# Patient Record
Sex: Female | Born: 1975 | Race: White | Hispanic: No | Marital: Married | Smoking: Never smoker
Health system: Southern US, Community
[De-identification: ages and names within clinical notes are randomized; demographics above are authoritative.]

## PROBLEM LIST (undated history)

## (undated) DIAGNOSIS — G40909 Epilepsy, unspecified, not intractable, without status epilepticus: Secondary | ICD-10-CM

## (undated) DIAGNOSIS — Z973 Presence of spectacles and contact lenses: Secondary | ICD-10-CM

## (undated) DIAGNOSIS — R569 Unspecified convulsions: Secondary | ICD-10-CM

## (undated) DIAGNOSIS — N6029 Fibroadenosis of unspecified breast: Secondary | ICD-10-CM

## (undated) HISTORY — DX: Unspecified convulsions: R56.9

## (undated) HISTORY — PX: BREAST BIOPSY: SHX20

## (undated) HISTORY — DX: Fibroadenosis of unspecified breast: N60.29

## (undated) HISTORY — PX: OTHER SURGICAL HISTORY: SHX169

## (undated) HISTORY — DX: Epilepsy, unspecified, not intractable, without status epilepticus: G40.909

---

## 1998-03-27 ENCOUNTER — Other Ambulatory Visit: Admission: RE | Admit: 1998-03-27 | Discharge: 1998-03-27 | Payer: Self-pay | Admitting: Gynecology

## 1999-07-15 ENCOUNTER — Other Ambulatory Visit: Admission: RE | Admit: 1999-07-15 | Discharge: 1999-07-15 | Payer: Self-pay | Admitting: Gynecology

## 2000-03-02 ENCOUNTER — Other Ambulatory Visit: Admission: RE | Admit: 2000-03-02 | Discharge: 2000-03-02 | Payer: Self-pay | Admitting: Gynecology

## 2000-09-14 ENCOUNTER — Other Ambulatory Visit: Admission: RE | Admit: 2000-09-14 | Discharge: 2000-09-14 | Payer: Self-pay | Admitting: Gynecology

## 2005-06-23 ENCOUNTER — Inpatient Hospital Stay: Payer: Self-pay | Admitting: Unknown Physician Specialty

## 2010-06-12 HISTORY — PX: BREAST SURGERY: SHX581

## 2010-06-24 ENCOUNTER — Ambulatory Visit: Payer: Self-pay | Admitting: Family Medicine

## 2011-07-27 ENCOUNTER — Ambulatory Visit: Payer: Self-pay | Admitting: General Surgery

## 2012-09-02 ENCOUNTER — Other Ambulatory Visit: Payer: Self-pay

## 2012-09-02 MED ORDER — CARBAMAZEPINE ER 400 MG PO TB12: 400.0000 mg | ORAL_TABLET | Freq: Two times a day (BID) | ORAL | Status: DC

## 2012-10-19 HISTORY — PX: INTRAUTERINE DEVICE (IUD) INSERTION: SHX5877

## 2012-11-12 ENCOUNTER — Other Ambulatory Visit: Payer: Self-pay | Admitting: Neurology

## 2013-07-04 ENCOUNTER — Telehealth: Payer: Self-pay | Admitting: *Deleted

## 2013-07-04 NOTE — Telephone Encounter (Signed)
Prior Dr. Sandria ManlyLove patient.  Needs new physician.  States that she has seen Darrol Angelarolyn Martin before. Needs an appointment in order to get her medication refilled.  Please call as necessary.  Thank you

## 2013-07-05 NOTE — Telephone Encounter (Signed)
Dr. Terrace ArabiaYan, has cosigned Darrol Angelarolyn Martin, NP notes in [facility] (10-01-2011).

## 2013-07-06 ENCOUNTER — Telehealth: Payer: Self-pay | Admitting: Nurse Practitioner

## 2013-07-10 ENCOUNTER — Telehealth: Payer: Self-pay | Admitting: Nurse Practitioner

## 2013-07-10 MED ORDER — CARBAMAZEPINE ER 400 MG PO TB12: 400.0000 mg | ORAL_TABLET | Freq: Two times a day (BID) | ORAL | Status: DC

## 2013-07-10 NOTE — Telephone Encounter (Signed)
Rx has been resent 

## 2013-07-10 NOTE — Telephone Encounter (Signed)
Patient has appt scheduled.  Rx has been sent

## 2013-07-10 NOTE — Telephone Encounter (Signed)
Pt called requesting her carbamazepine (TEGRETOL XR) 400 MG 12 hr tablet [facility] [place]. I updated her information in the system she is no longer using [facility]. Thanks

## 2013-09-15 ENCOUNTER — Other Ambulatory Visit: Payer: Self-pay | Admitting: Neurology

## 2013-10-30 ENCOUNTER — Encounter: Payer: Self-pay | Admitting: *Deleted

## 2013-10-31 ENCOUNTER — Ambulatory Visit (INDEPENDENT_AMBULATORY_CARE_PROVIDER_SITE_OTHER): Payer: BC Managed Care – PPO | Admitting: Nurse Practitioner

## 2013-10-31 ENCOUNTER — Encounter: Payer: Self-pay | Admitting: Nurse Practitioner

## 2013-10-31 VITALS — BP 112/77 | HR 77 | Ht 71.0 in | Wt 195.0 lb

## 2013-10-31 DIAGNOSIS — G40209 Localization-related (focal) (partial) symptomatic epilepsy and epileptic syndromes with complex partial seizures, not intractable, without status epilepticus: Secondary | ICD-10-CM

## 2013-10-31 DIAGNOSIS — Z5181 Encounter for therapeutic drug level monitoring: Secondary | ICD-10-CM

## 2013-10-31 MED ORDER — CARBAMAZEPINE ER 400 MG PO TB12: 400.0000 mg | ORAL_TABLET | Freq: Two times a day (BID) | ORAL | Status: DC

## 2013-10-31 NOTE — Progress Notes (Signed)
I have read the note, and I agree with the clinical assessment and plan.  Reshunda Strider KEITH   

## 2013-10-31 NOTE — Progress Notes (Signed)
[facility]  PATIENT: Connie Fuentes DOB: Feb 24, 1976   REASON FOR VISIT: follow up for seizure disorder   HISTORY OF PRESENT ILLNESS:Connie Fuentes, 38 year old white female returns for followup. She was last seen in our office 10/01/11. She has a history of seizure disorder complex partial. Her last seizure activity was last year when she claims she was under a lot of stress at work. She is no longer at that job.  She is currently taking Tegretol-XR twice a day, denies missing any doses of the medication. She denies any falls, any periods of daytime drowsiness, any balance issues.  She returns for reevaluation  REVIEW OF SYSTEMS: Full 14 system review of systems performed and notable only for those listed, all others are neg:  Constitutional: N/A  Cardiovascular: N/A  Ear/Nose/Throat: N/A  Skin: N/A  Eyes: N/A  Respiratory: N/A  Gastroitestinal: N/A  Hematology/Lymphatic: N/A  Endocrine: N/A Musculoskeletal:N/A  Allergy/Immunology: N/A  Neurological: N/A Psychiatric: N/A Sleep : NA   ALLERGIES: Not on File  HOME MEDICATIONS: Outpatient Prescriptions Prior to Visit  Medication Sig Dispense Refill  . carbamazepine (TEGRETOL XR) 400 MG 12 hr tablet TAKE 1 TABLET TWICE A DAY  180 tablet  0   No facility-administered medications prior to visit.    PAST MEDICAL HISTORY: Past Medical History  Diagnosis Date  . Seizure disorder     PAST SURGICAL HISTORY: Past Surgical History  Procedure Laterality Date  . None      FAMILY HISTORY: History reviewed. No pertinent family history.  SOCIAL HISTORY: History   Social History  . Marital Status: Married    Spouse Name: [name]    Number of Children: 1  . Years of Education: 16   Occupational History  . Not on file.   Social History Main Topics  . Smoking status: Never Smoker   . Smokeless tobacco: Never Used  . Alcohol Use: Yes     Comment: 2 per week  . Drug Use: No  . Sexual Activity: Not on  file   Other Topics Concern  . 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   Patient is working full-time.   Patient has a Chief Operating Officer' degree   Patient is right-handed.   Patient drinks two cups of coffee daily.     PHYSICAL EXAM  Filed Vitals:   10/31/13 0831  BP: 112/77  Pulse: 77  Height: 5\' 11"  (1.803 m)  Weight: 195 lb (88.451 kg)   Body mass index is 27.21 kg/(m^2). General: well developed, well  nourished, seated, in no evident distress Neurologic Exam  Mental Status: Awake and fully alert.  Oriented to place and time.  Recent and remote memory intact.  Attention span, concentration, and fund of knowledge appropriate.  Mood and affect appropriate. Cranial Nerves:  Pupils equal, briskly reactive to light.  Extraocular movements full without nystagmus.  Visual fields full to confrontation.  Hearing intact and symmetric to finger snap.  Facial sensation intact.  Face, tongue, palate move normally and symmetrically.  Neck flexion and extension normal. Motor: Normal bulk and tone.  Normal strength in all tested extremity muscles. Coordination: Rapid alternating movements normal in all extremities.  Finger-to-nose and heel-to-shin performed accurately bilaterally. Gait and Station: Arises from chair without difficulty.  Stance is normal.  Gait demonstrates normal stride length and balance.  Able to heel, toe, and tandem walk without difficulty. Reflexes:  2+ and symmetric.  Toes downgoing.  DIAGNOSTIC DATA (LABS, IMAGING, TESTING) - ASSESSMENT AND PLAN  38 y.o. year old female  has a past medical history of Seizure disorder. here  to followup. She is currently on Tegretol without side effects. She is a patient of Dr. Terrace ArabiaYan who is out of the office   Continue Tegretol at current dose Will refill for one year Check labs today Followup yearly and when necessary Nilda RiggsNancy Carolyn Latrell Reitan, [facility]GNP, [facility],  APRN  [facility] [address], [address] [place], [place] [address] [phone]

## 2013-10-31 NOTE — Patient Instructions (Signed)
Continue Tegretol at current dose Will refill for one year Check labs today Followup yearly and when necessary

## 2013-11-01 LAB — CBC WITH DIFFERENTIAL/PLATELET
Basophils Absolute: 0 10*3/uL (ref 0.0–0.2)
Basos: 1 %
Eos: 3 %
Eosinophils Absolute: 0.2 10*3/uL (ref 0.0–0.4)
HCT: 40.2 % (ref 34.0–46.6)
Hemoglobin: 13.9 g/dL (ref 11.1–15.9)
Lymphocytes Absolute: 1.9 10*3/uL (ref 0.7–3.1)
Lymphs: 33 %
MCH: 30.9 pg (ref 26.6–33.0)
MCHC: 34.6 g/dL (ref 31.5–35.7)
MCV: 89 fL (ref 79–97)
Monocytes Absolute: 0.6 10*3/uL (ref 0.1–0.9)
Monocytes: 11 %
Neutrophils Absolute: 3.1 10*3/uL (ref 1.4–7.0)
Neutrophils Relative %: 52 %
RBC: 4.5 x10E6/uL (ref 3.77–5.28)
RDW: 13 % (ref 12.3–15.4)
WBC: 5.8 10*3/uL (ref 3.4–10.8)

## 2013-11-01 LAB — COMPREHENSIVE METABOLIC PANEL
ALT: 21 IU/L (ref 0–32)
AST: 19 IU/L (ref 0–40)
Albumin/Globulin Ratio: 1.7 (ref 1.1–2.5)
Albumin: 4.3 g/dL (ref 3.5–5.5)
Alkaline Phosphatase: 80 IU/L (ref 39–117)
BUN/Creatinine Ratio: 22 — ABNORMAL HIGH (ref 8–20)
BUN: 13 mg/dL (ref 6–20)
CO2: 24 mmol/L (ref 18–29)
Calcium: 9.2 mg/dL (ref 8.7–10.2)
Chloride: 102 mmol/L (ref 96–108)
Creatinine, Ser: 0.6 mg/dL (ref 0.57–1.00)
GFR calc Af Amer: 135 mL/min/{1.73_m2} (ref >59)
GFR calc non Af Amer: 117 mL/min/{1.73_m2} (ref >59)
Globulin, Total: 2.6 g/dL (ref 1.5–4.5)
Glucose: 57 mg/dL — ABNORMAL LOW (ref 65–99)
Potassium: 4.1 mmol/L (ref 3.5–5.2)
Sodium: 137 mmol/L (ref 134–144)
Total Bilirubin: 0.4 mg/dL (ref 0.0–1.2)
Total Protein: 6.9 g/dL (ref 6.0–8.5)

## 2013-11-01 LAB — CARBAMAZEPINE LEVEL, TOTAL: Carbamazepine Lvl: 8.7 ug/mL (ref 4.0–12.0)

## 2013-11-01 NOTE — Progress Notes (Signed)
Quick Note:  I called and LMVM Cell # results of her labs, that they looked good. She is to call back if needed. ______

## 2014-11-02 ENCOUNTER — Ambulatory Visit: Payer: BC Managed Care – PPO | Admitting: Nurse Practitioner

## 2014-11-07 ENCOUNTER — Ambulatory Visit (INDEPENDENT_AMBULATORY_CARE_PROVIDER_SITE_OTHER): Payer: BC Managed Care – PPO | Admitting: Nurse Practitioner

## 2014-11-07 ENCOUNTER — Encounter: Payer: Self-pay | Admitting: Nurse Practitioner

## 2014-11-07 VITALS — BP 125/80 | HR 73 | Ht 71.0 in | Wt 192.8 lb

## 2014-11-07 DIAGNOSIS — G40209 Localization-related (focal) (partial) symptomatic epilepsy and epileptic syndromes with complex partial seizures, not intractable, without status epilepticus: Secondary | ICD-10-CM

## 2014-11-07 DIAGNOSIS — Z5181 Encounter for therapeutic drug level monitoring: Secondary | ICD-10-CM

## 2014-11-07 MED ORDER — CARBAMAZEPINE ER 400 MG PO TB12: 400.0000 mg | ORAL_TABLET | Freq: Two times a day (BID) | ORAL | Status: DC

## 2014-11-07 NOTE — Patient Instructions (Signed)
Will renew Tegretol Obtain labs today Call for seizure activity F/U yearly

## 2014-11-07 NOTE — Progress Notes (Signed)
I have reviewed and agreed above plan. 

## 2014-11-07 NOTE — Progress Notes (Signed)
[facility]  PATIENT: Connie Fuentes DOB: 03/26/1976   REASON FOR VISIT: Follow-up for history of seizure disorder HISTORY FROM: Patient    HISTORY OF PRESENT ILLNESS:Connie Fuentes, 39 year old white female returns for followup. She was last seen in our office 10/31/2013 . She has a history of seizure disorder complex partial. Her last seizure activity was 2014.She claims she was under a lot of stress at work.  She is currently taking Tegretol-XR twice a day, denies missing any doses of the medication. She denies any falls, any periods of daytime drowsiness, any balance issues. She returns for reevaluation   REVIEW OF SYSTEMS: Full 14 system review of systems performed and notable only for those listed, all others are neg:  Constitutional: neg  Cardiovascular: neg Ear/Nose/Throat: neg  Skin: neg Eyes: neg Respiratory: neg Gastroitestinal: neg  Hematology/Lymphatic: neg  Endocrine: neg Musculoskeletal:neg Allergy/Immunology: neg Neurological: neg Psychiatric: neg Sleep : neg   ALLERGIES: No Known Allergies  HOME MEDICATIONS: Outpatient Prescriptions Prior to Visit  Medication Sig Dispense Refill  . carbamazepine (TEGRETOL XR) 400 MG 12 hr tablet Take 1 tablet (400 mg total) by mouth 2 (two) times daily. 180 tablet 3   No facility-administered medications prior to visit.    PAST MEDICAL HISTORY: Past Medical History  Diagnosis Date  . Seizure disorder     PAST SURGICAL HISTORY: Past Surgical History  Procedure Laterality Date  . None      FAMILY HISTORY: History reviewed. No pertinent family history.  SOCIAL HISTORY: History   Social History  . Marital Status: Married    Spouse Name: Connie Fuentes  . Number of Children: 1  . Years of Education: 16   Occupational History  . Not on file.   Social History Main Topics  . Smoking status: Never Smoker   . Smokeless tobacco: Never Used  . Alcohol Use: Yes     Comment: 2 per week  . Drug  Use: No  . Sexual Activity: Not on file   Other Topics Concern  . Not on file   Social History Narrative   Patient is married Connie Fuentes) and lives at home with her husband and child.   Patient is working full-time.   Patient has a Chief Operating Officer' degree   Patient is right-handed.   Patient drinks two cups of coffee daily.     PHYSICAL EXAM  Filed Vitals:   11/07/14 0754  BP: 125/80  Pulse: 73  Height: 5\' 11"  (1.803 m)  Weight: 192 lb 12.8 oz (87.454 kg)   Body mass index is 26.9 kg/(m^2). General: well developed, well nourished, seated, in no evident distress Neurologic Exam  Mental Status: Awake and fully alert. Oriented to place and time. Recent and remote memory intact. Attention span, concentration, and fund of knowledge appropriate. Mood and affect appropriate. Cranial Nerves: Pupils equal, briskly reactive to light. Extraocular movements full without nystagmus. Visual fields full to confrontation. Hearing intact and symmetric to finger snap. Facial sensation intact. Face, tongue, palate move normally and symmetrically. Neck flexion and extension normal. Motor: Normal bulk and tone. Normal strength in all tested extremity muscles. Coordination: Rapid alternating movements normal in all extremities. Finger-to-nose and heel-to-shin performed accurately bilaterally. Gait and Station: Arises from chair without difficulty. Stance is normal. Gait demonstrates normal stride length and balance. Able to heel, toe, and tandem walk without difficulty. Reflexes: 2+ and symmetric. Toes downgoing.    DIAGNOSTIC DATA (LABS, IMAGING, TESTING)   ASSESSMENT AND PLAN  39 y.o. year old female  has a past medical history of Seizure disorder. here to follow-up. Last seizure activity was 2014.  Will renew Tegretol by mail order Obtain labs today, CBC, CMP and CBZ level Call for seizure activity F/U yearly Connie Fuentes, [facility], [facility], APRN  [facility] [address], [address] [place], [place] [address] [phone]

## 2014-11-08 LAB — COMPREHENSIVE METABOLIC PANEL
ALT: 18 IU/L (ref 0–32)
AST: 18 IU/L (ref 0–40)
Albumin/Globulin Ratio: 1.8 (ref 1.1–2.5)
Albumin: 4.1 g/dL (ref 3.5–5.5)
Alkaline Phosphatase: 70 IU/L (ref 39–117)
BUN/Creatinine Ratio: 23 — ABNORMAL HIGH (ref 8–20)
BUN: 14 mg/dL (ref 6–20)
Bilirubin Total: 0.3 mg/dL (ref 0.0–1.2)
CO2: 24 mmol/L (ref 18–29)
Calcium: 9.5 mg/dL (ref 8.7–10.2)
Chloride: 102 mmol/L (ref 97–108)
Creatinine, Ser: 0.62 mg/dL (ref 0.57–1.00)
GFR calc Af Amer: 132 mL/min/{1.73_m2} (ref >59)
GFR calc non Af Amer: 115 mL/min/{1.73_m2} (ref >59)
Globulin, Total: 2.3 g/dL (ref 1.5–4.5)
Glucose: 70 mg/dL (ref 65–99)
Potassium: 4.3 mmol/L (ref 3.5–5.2)
Sodium: 142 mmol/L (ref 134–144)
Total Protein: 6.4 g/dL (ref 6.0–8.5)

## 2014-11-08 LAB — CBC WITH DIFFERENTIAL/PLATELET
Basophils Absolute: 0 10*3/uL (ref 0.0–0.2)
Basos: 0 %
EOS (ABSOLUTE): 0.1 10*3/uL (ref 0.0–0.4)
Eos: 2 %
Hematocrit: 38.9 % (ref 34.0–46.6)
Hemoglobin: 13.1 g/dL (ref 11.1–15.9)
Immature Grans (Abs): 0 10*3/uL (ref 0.0–0.1)
Immature Granulocytes: 0 %
Lymphocytes Absolute: 1.9 10*3/uL (ref 0.7–3.1)
Lymphs: 35 %
MCH: 31 pg (ref 26.6–33.0)
MCHC: 33.7 g/dL (ref 31.5–35.7)
MCV: 92 fL (ref 79–97)
Monocytes Absolute: 0.6 10*3/uL (ref 0.1–0.9)
Monocytes: 10 %
Neutrophils Absolute: 2.9 10*3/uL (ref 1.4–7.0)
Neutrophils: 53 %
Platelets: 268 10*3/uL (ref 150–379)
RBC: 4.23 x10E6/uL (ref 3.77–5.28)
RDW: 13.1 % (ref 12.3–15.4)
WBC: 5.6 10*3/uL (ref 3.4–10.8)

## 2014-11-08 LAB — CARBAMAZEPINE LEVEL, TOTAL: Carbamazepine Lvl: 8.4 ug/mL (ref 4.0–12.0)

## 2014-11-08 NOTE — Progress Notes (Signed)
Quick Note:  I called and LMVM for pt that labs looked good. She is to call back if questions. ______

## 2014-11-09 NOTE — Progress Notes (Signed)
I have reviewed and agreed above plan. 

## 2015-11-07 ENCOUNTER — Encounter: Payer: Self-pay | Admitting: Nurse Practitioner

## 2015-11-07 ENCOUNTER — Ambulatory Visit (INDEPENDENT_AMBULATORY_CARE_PROVIDER_SITE_OTHER): Payer: BC Managed Care – PPO | Admitting: Nurse Practitioner

## 2015-11-07 VITALS — BP 115/67 | HR 68 | Ht 71.0 in | Wt 165.6 lb

## 2015-11-07 DIAGNOSIS — Z5181 Encounter for therapeutic drug level monitoring: Secondary | ICD-10-CM

## 2015-11-07 DIAGNOSIS — G40209 Localization-related (focal) (partial) symptomatic epilepsy and epileptic syndromes with complex partial seizures, not intractable, without status epilepticus: Secondary | ICD-10-CM

## 2015-11-07 MED ORDER — CARBAMAZEPINE ER 400 MG PO TB12: 400.0000 mg | ORAL_TABLET | Freq: Two times a day (BID) | ORAL | 3 refills | Status: DC

## 2015-11-07 NOTE — Patient Instructions (Signed)
Will renew Tegretol by mail order Obtain labs today, CBC, CMP and CBZ level Call for seizure activity F/U yearly  

## 2015-11-07 NOTE — Progress Notes (Signed)
[facility]  PATIENT: Connie Fuentes DOB: 1975/12/28   REASON FOR VISIT: Follow-up for history of seizure disorder HISTORY FROM: Patient    HISTORY OF PRESENT ILLNESS:Connie Fuentes, 40 year old white female returns for followup. She was last seen in our office 11/07/14.  She has a history of seizure disorder complex partial. Her last seizure activity was 2014.She claims she was under a lot of stress at work at that time.  She is currently taking Tegretol-XR twice a day, denies missing any doses of the medication. She denies any falls, any periods of daytime drowsiness, any balance issues. She returns for reevaluation   REVIEW OF SYSTEMS: Full 14 system review of systems performed and notable only for those listed, all others are neg:  Constitutional: neg  Cardiovascular: neg Ear/Nose/Throat: neg  Skin: neg Eyes: neg Respiratory: neg Gastroitestinal: neg  Hematology/Lymphatic: neg  Endocrine: neg Musculoskeletal:neg Allergy/Immunology: neg Neurological: neg Psychiatric: neg Sleep : neg   ALLERGIES: No Known Allergies  HOME MEDICATIONS: Outpatient Medications Prior to Visit  Medication Sig Dispense Refill  . carbamazepine (TEGRETOL XR) 400 MG 12 hr tablet Take 1 tablet (400 mg total) by mouth 2 (two) times daily. 180 tablet 3   No facility-administered medications prior to visit.     PAST MEDICAL HISTORY: Past Medical History:  Diagnosis Date  . Seizure disorder (HCC)     PAST SURGICAL HISTORY: Past Surgical History:  Procedure Laterality Date  . none      FAMILY HISTORY: History reviewed. No pertinent family history.  SOCIAL HISTORY: Social History   Social History  . Marital status: Married    Spouse name: [name]  . Number of children: 1  . Years of education: 75   Occupational History  . Not on file.   Social History Main Topics  . Smoking status: Never Smoker  . Smokeless tobacco: Never Used  . Alcohol use Yes     Comment:  2 per week  . Drug use: No  . Sexual activity: Not on file   Other Topics Concern  . Not on file   Social History Narrative   Patient is married [name]) and lives at home with her husband and child.   Patient is working full-time.   Patient has a Chief Operating Officer' degree   Patient is right-handed.   Patient drinks two cups of coffee daily.     PHYSICAL EXAM  Vitals:   11/07/15 0758  BP: 115/67  Pulse: 68  Weight: 165 lb 9.6 oz (75.1 kg)  Height:  (1.803 m)   Body mass index is 23.1 kg/m. General: well developed, well nourished, seated, in no evident distress Neurologic Exam  Mental Status: Awake and fully alert. Oriented to place and time. Recent and remote memory intact. Attention span, concentration, and fund of knowledge appropriate. Mood and affect appropriate. Cranial Nerves: Pupils equal, briskly reactive to light. Extraocular movements full without nystagmus. Visual fields full to confrontation. Hearing intact and symmetric to finger snap. Facial sensation intact. Face, tongue, palate move normally and symmetrically. Neck flexion and extension normal. Motor: Normal bulk and tone. Normal strength in all tested extremity muscles. Coordination: Rapid alternating movements normal in all extremities. Finger-to-nose and heel-to-shin performed accurately bilaterally. Gait and Station: Arises from chair without difficulty. Stance is normal. Gait demonstrates normal stride length and balance. Able to heel, toe, and tandem walk without difficulty. Reflexes: 2+ and symmetric. Toes downgoing.    DIAGNOSTIC DATA (LABS, IMAGING, TESTING)   ASSESSMENT AND PLAN  40  y.o. year old female  has a past medical history of Seizure disorder (HCC). here to follow-up. Last seizure activity was 2014.The patient is a current patient of Dr. Terrace Arabia who is out of the office today . This note is sent to the work in doctor.     Will renew Tegretol by mail order Obtain labs today,  CBC, CMP and CBZ level Call for seizure activity F/U yearly Nilda Riggs, [facility], [facility], APRN  [facility] [address] [place], [place] [address] [phone]

## 2015-11-08 ENCOUNTER — Telehealth: Payer: Self-pay | Admitting: Neurology

## 2015-11-08 DIAGNOSIS — E875 Hyperkalemia: Secondary | ICD-10-CM

## 2015-11-08 LAB — COMPREHENSIVE METABOLIC PANEL
ALT: 24 IU/L (ref 0–32)
AST: 18 IU/L (ref 0–40)
Albumin/Globulin Ratio: 1.8 (ref 1.2–2.2)
Albumin: 4.2 g/dL (ref 3.5–5.5)
Alkaline Phosphatase: 62 IU/L (ref 39–117)
BUN/Creatinine Ratio: 20 (ref 9–23)
BUN: 12 mg/dL (ref 6–20)
Bilirubin Total: 0.3 mg/dL (ref 0.0–1.2)
CO2: 25 mmol/L (ref 18–29)
Calcium: 9.6 mg/dL (ref 8.7–10.2)
Chloride: 104 mmol/L (ref 96–106)
Creatinine, Ser: 0.59 mg/dL (ref 0.57–1.00)
GFR calc Af Amer: 134 mL/min/{1.73_m2} (ref >59)
GFR calc non Af Amer: 116 mL/min/{1.73_m2} (ref >59)
Globulin, Total: 2.3 g/dL (ref 1.5–4.5)
Glucose: 72 mg/dL (ref 65–99)
Potassium: 6 mmol/L — ABNORMAL HIGH (ref 3.5–5.2)
Sodium: 142 mmol/L (ref 134–144)
Total Protein: 6.5 g/dL (ref 6.0–8.5)

## 2015-11-08 LAB — CBC WITH DIFFERENTIAL/PLATELET
Basophils Absolute: 0 10*3/uL (ref 0.0–0.2)
Basos: 1 %
EOS (ABSOLUTE): 0.1 10*3/uL (ref 0.0–0.4)
Eos: 2 %
Hematocrit: 41.5 % (ref 34.0–46.6)
Hemoglobin: 13.5 g/dL (ref 11.1–15.9)
Immature Grans (Abs): 0 10*3/uL (ref 0.0–0.1)
Immature Granulocytes: 0 %
Lymphocytes Absolute: 1.8 10*3/uL (ref 0.7–3.1)
Lymphs: 39 %
MCH: 30.6 pg (ref 26.6–33.0)
MCHC: 32.5 g/dL (ref 31.5–35.7)
MCV: 94 fL (ref 79–97)
Monocytes Absolute: 0.4 10*3/uL (ref 0.1–0.9)
Monocytes: 8 %
Neutrophils Absolute: 2.3 10*3/uL (ref 1.4–7.0)
Neutrophils: 50 %
Platelets: 270 10*3/uL (ref 150–379)
RBC: 4.41 x10E6/uL (ref 3.77–5.28)
RDW: 12.7 % (ref 12.3–15.4)
WBC: 4.7 10*3/uL (ref 3.4–10.8)

## 2015-11-08 LAB — CARBAMAZEPINE LEVEL, TOTAL: Carbamazepine (Tegretol), S: 9.5 ug/mL (ref 4.0–12.0)

## 2015-11-08 NOTE — Telephone Encounter (Signed)
I called the patient. The blood work was unremarkable with exception that the potassium level was elevated at 6.0. This likely is related to the blood draw itself, we will recheck the blood next week.

## 2015-11-08 NOTE — Telephone Encounter (Signed)
-----   Message from Guy Begin, RN sent at 11/08/2015 10:41 AM EDT ----- Raquel Sarna, CM out

## 2015-11-08 NOTE — Telephone Encounter (Signed)
Please refer to separate phone note from this date, the patient was called with blood work results.

## 2015-11-12 ENCOUNTER — Telehealth: Payer: Self-pay | Admitting: *Deleted

## 2015-11-12 NOTE — Telephone Encounter (Signed)
-----   Message from Nilda Riggs, NP sent at 11/11/2015  8:46 AM EDT ----- Labs okay, potassium a little high however she was not fasting. Blood level of carbamazepine good. Please call the patient

## 2015-11-12 NOTE — Telephone Encounter (Signed)
LMVM for pt to return call for lab results.  

## 2015-11-13 NOTE — Telephone Encounter (Signed)
Spoke to pt and let her know that the labs okay, potassium slightly elevated (non fasting).  Carbamazepine good level.  Pt verbalized understanding.

## 2015-11-13 NOTE — Telephone Encounter (Signed)
LMVM for pt to return call.   

## 2016-09-21 ENCOUNTER — Encounter: Payer: Self-pay | Admitting: Nurse Practitioner

## 2016-11-06 ENCOUNTER — Ambulatory Visit: Payer: BC Managed Care – PPO | Admitting: Nurse Practitioner

## 2016-11-14 ENCOUNTER — Other Ambulatory Visit: Payer: Self-pay | Admitting: Nurse Practitioner

## 2016-12-22 NOTE — Progress Notes (Signed)
[facility]  PATIENT: Connie Fuentes DOB: [birth year]   REASON FOR VISIT: Follow-up for history of seizure disorder HISTORY FROM: Patient    HISTORY OF PRESENT ILLNESS:Connie Fuentes, 41 year old white female returns for followup.  She has a history of seizure disorder complex partial. Her last seizure activity was 2014.She claims she was under a lot of stress at work at that time.  She is currently taking Tegretol-XR twice a day, denies missing any doses of the medication. She denies any falls, any periods of daytime drowsiness, any balance issues. She returns for reevaluation   REVIEW OF SYSTEMS: Full 14 system review of systems performed and notable only for those listed, all others are neg:  Constitutional: neg  Cardiovascular: neg Ear/Nose/Throat: neg  Skin: neg Eyes: neg Respiratory: neg Gastroitestinal: neg  Hematology/Lymphatic: neg  Endocrine: neg Musculoskeletal:neg Allergy/Immunology: neg Neurological: neg Psychiatric: neg Sleep : neg   ALLERGIES: No Known Allergies  HOME MEDICATIONS: Outpatient Medications Prior to Visit  Medication Sig Dispense Refill  . carbamazepine (TEGRETOL XR) 400 MG 12 hr tablet TAKE 1 TABLET TWICE A DAY 180 tablet 3   No facility-administered medications prior to visit.     PAST MEDICAL HISTORY: Past Medical History:  Diagnosis Date  . Seizure disorder (HCC)   . Seizures (HCC)     PAST SURGICAL HISTORY: Past Surgical History:  Procedure Laterality Date  . none      FAMILY HISTORY: History reviewed. No pertinent family history.  SOCIAL HISTORY: Social History   Social History  . Marital status: Married    Spouse name: [name]  . Number of children: 1  . Years of education: 54   Occupational History  .      [employer]   Social History Main Topics  . Smoking status: Never Smoker  . Smokeless tobacco: Never Used  . Alcohol use Yes     Comment: 2 per week  . Drug use: No  . Sexual activity: Not  on file   Other Topics Concern  . Not on file   Social History Narrative   Patient is married [name]) and lives at home with her husband and child.   Patient is working full-time.   Patient has a Chief Operating Officer' degree   Patient is right-handed.   Patient drinks two cups of coffee daily.     PHYSICAL EXAM  Vitals:   12/23/16 0942  BP: 115/77  Pulse: 62  Weight: 169 lb 3.2 oz (76.7 kg)   Body mass index is 23.6 kg/m. General: well developed, well nourished, seated, in no evident distress Neurologic Exam  Mental Status: Awake and fully alert. Oriented to place and time. Recent and remote memory intact. Attention span, concentration, and fund of knowledge appropriate. Mood and affect appropriate. Cranial Nerves: Pupils equal, briskly reactive to light. Extraocular movements full without nystagmus. Visual fields full to confrontation. Hearing intact and symmetric to finger snap. Facial sensation intact. Face, tongue, palate move normally and symmetrically. Neck flexion and extension normal. Motor: Normal bulk and tone. Normal strength in all tested extremity muscles. Coordination: Rapid alternating movements normal in all extremities. Finger-to-nose and heel-to-shin performed accurately bilaterally. Gait and Station: Arises from chair without difficulty. Stance is normal. Gait demonstrates normal stride length and balance. Able to heel, toe, and tandem walk without difficulty. Reflexes: 2+ and symmetric. Toes downgoing.    DIAGNOSTIC DATA (LABS, IMAGING, TESTING)   ASSESSMENT AND PLAN  42 y.o. year old female  has a past medical history of  Seizure disorder (HCC) and Seizures (HCC). here to follow-up. Last seizure activity was 2014.   PLAN: Will renew Tegretol by mail order when labs are back Obtain labs today, CBC, CMP To monitor side effects of Tegretol  CBZ level to monitor for toxicity/therapeutic level Call for seizure activity F/U yearly Nilda RiggsNancy Carolyn  Martin, [facility]NP, [facility], APRN  [facility] [address], [address] [place], [place] [address] [phone]

## 2016-12-23 ENCOUNTER — Ambulatory Visit (INDEPENDENT_AMBULATORY_CARE_PROVIDER_SITE_OTHER): Payer: BC Managed Care – PPO | Admitting: Nurse Practitioner

## 2016-12-23 ENCOUNTER — Encounter: Payer: Self-pay | Admitting: Nurse Practitioner

## 2016-12-23 VITALS — BP 115/77 | HR 62 | Wt 169.2 lb

## 2016-12-23 DIAGNOSIS — G40209 Localization-related (focal) (partial) symptomatic epilepsy and epileptic syndromes with complex partial seizures, not intractable, without status epilepticus: Secondary | ICD-10-CM | POA: Diagnosis not present

## 2016-12-23 DIAGNOSIS — Z5181 Encounter for therapeutic drug level monitoring: Secondary | ICD-10-CM | POA: Diagnosis not present

## 2016-12-23 NOTE — Patient Instructions (Signed)
Will renew Tegretol by mail order Obtain labs today, CBC, CMP and CBZ level Call for seizure activity F/U yearly

## 2016-12-24 ENCOUNTER — Telehealth: Payer: Self-pay | Admitting: *Deleted

## 2016-12-24 ENCOUNTER — Other Ambulatory Visit: Payer: Self-pay | Admitting: Nurse Practitioner

## 2016-12-24 LAB — COMPREHENSIVE METABOLIC PANEL
ALT: 21 IU/L (ref 0–32)
AST: 21 IU/L (ref 0–40)
Albumin/Globulin Ratio: 1.9 (ref 1.2–2.2)
Albumin: 4.3 g/dL (ref 3.5–5.5)
Alkaline Phosphatase: 52 IU/L (ref 39–117)
BUN/Creatinine Ratio: 18 (ref 9–23)
BUN: 11 mg/dL (ref 6–24)
Bilirubin Total: 0.3 mg/dL (ref 0.0–1.2)
CO2: 27 mmol/L (ref 20–29)
Calcium: 9.7 mg/dL (ref 8.7–10.2)
Chloride: 101 mmol/L (ref 96–106)
Creatinine, Ser: 0.62 mg/dL (ref 0.57–1.00)
GFR calc Af Amer: 130 mL/min/{1.73_m2} (ref >59)
GFR calc non Af Amer: 113 mL/min/{1.73_m2} (ref >59)
Globulin, Total: 2.3 g/dL (ref 1.5–4.5)
Glucose: 65 mg/dL (ref 65–99)
Potassium: 4.8 mmol/L (ref 3.5–5.2)
Sodium: 140 mmol/L (ref 134–144)
Total Protein: 6.6 g/dL (ref 6.0–8.5)

## 2016-12-24 LAB — CBC WITH DIFFERENTIAL/PLATELET
Basophils Absolute: 0 10*3/uL (ref 0.0–0.2)
Basos: 1 %
EOS (ABSOLUTE): 0.1 10*3/uL (ref 0.0–0.4)
Eos: 2 %
Hematocrit: 41.4 % (ref 34.0–46.6)
Hemoglobin: 13.3 g/dL (ref 11.1–15.9)
Immature Grans (Abs): 0 10*3/uL (ref 0.0–0.1)
Immature Granulocytes: 0 %
Lymphocytes Absolute: 1.6 10*3/uL (ref 0.7–3.1)
Lymphs: 40 %
MCH: 31.4 pg (ref 26.6–33.0)
MCHC: 32.1 g/dL (ref 31.5–35.7)
MCV: 98 fL — ABNORMAL HIGH (ref 79–97)
Monocytes Absolute: 0.5 10*3/uL (ref 0.1–0.9)
Monocytes: 12 %
Neutrophils Absolute: 1.9 10*3/uL (ref 1.4–7.0)
Neutrophils: 45 %
Platelets: 264 10*3/uL (ref 150–379)
RBC: 4.24 x10E6/uL (ref 3.77–5.28)
RDW: 13.3 % (ref 12.3–15.4)
WBC: 4.1 10*3/uL (ref 3.4–10.8)

## 2016-12-24 LAB — CARBAMAZEPINE LEVEL, TOTAL: Carbamazepine (Tegretol), S: 6.7 ug/mL (ref 4.0–12.0)

## 2016-12-24 MED ORDER — CARBAMAZEPINE ER 400 MG PO TB12: 400.0000 mg | ORAL_TABLET | Freq: Two times a day (BID) | ORAL | 3 refills | Status: DC

## 2016-12-24 NOTE — Telephone Encounter (Signed)
LVM requesting call back for lab results. If patient returns call, phone staff may inform her that her lab results are okay. Left office number.

## 2016-12-28 NOTE — Progress Notes (Signed)
I have reviewed and agreed above plan. 

## 2016-12-28 NOTE — Telephone Encounter (Signed)
LVM informing patient her labs look good.. Left number for any questions. 

## 2017-01-19 ENCOUNTER — Ambulatory Visit (INDEPENDENT_AMBULATORY_CARE_PROVIDER_SITE_OTHER): Payer: BC Managed Care – PPO | Admitting: Obstetrics and Gynecology

## 2017-01-19 ENCOUNTER — Encounter: Payer: Self-pay | Admitting: Obstetrics and Gynecology

## 2017-01-19 VITALS — BP 110/70 | HR 70 | Ht 71.0 in | Wt 168.0 lb

## 2017-01-19 DIAGNOSIS — Z30431 Encounter for routine checking of intrauterine contraceptive device: Secondary | ICD-10-CM | POA: Diagnosis not present

## 2017-01-19 DIAGNOSIS — Z01419 Encounter for gynecological examination (general) (routine) without abnormal findings: Secondary | ICD-10-CM

## 2017-01-19 DIAGNOSIS — Z1231 Encounter for screening mammogram for malignant neoplasm of breast: Secondary | ICD-10-CM

## 2017-01-19 DIAGNOSIS — Z124 Encounter for screening for malignant neoplasm of cervix: Secondary | ICD-10-CM

## 2017-01-19 DIAGNOSIS — Z1151 Encounter for screening for human papillomavirus (HPV): Secondary | ICD-10-CM | POA: Diagnosis not present

## 2017-01-19 DIAGNOSIS — Z1239 Encounter for other screening for malignant neoplasm of breast: Secondary | ICD-10-CM

## 2017-01-19 NOTE — Progress Notes (Signed)
PCP:  Patient, No Pcp Per   Chief Complaint  Patient presents with  . Gynecologic Exam     HPI:      [name] is a 41 y.o. G1P1001 who LMP was Patient's last menstrual period was 12/20/2016 (exact date)., presents today for her annual examination.  Her menses are regular every 28-30 days, lasting 3 days.  Dysmenorrhea mild, occurring first 1-2 days of flow. She does not have intermenstrual bleeding.  Sex activity: single partner, contraception - IUD. Mirena placed 10/19/12. Pt will want another one.  Last Pap: March 07, 2014  Results were: no abnormalities /neg HPV DNA  Hx of STDs: none  There is no FH of breast cancer. There is no FH of ovarian cancer. The patient does do self-breast exams.  Tobacco use: The patient denies current or previous tobacco use. Alcohol use: none No drug use.  Exercise: moderately active  She does get adequate calcium and Vitamin D in her diet.    Past Medical History:  Diagnosis Date  . Seizure disorder (HCC)   . Seizures (HCC)     Past Surgical History:  Procedure Laterality Date  . BREAST SURGERY Right 06/2010   breast biopsy; fibroadenoma  . CESAREAN SECTION    . INTRAUTERINE DEVICE (IUD) INSERTION  10/19/2012   mirena  . none      Family History  Problem Relation Age of Onset  . Diabetes Mother   . Hyperlipidemia Mother   . Hypertension Mother     Social History   Social History  . Marital status: Married    Spouse name: [name]  . Number of children: 1  . Years of education: 83   Occupational History  .      [employer]   Social History Main Topics  . Smoking status: Never Smoker  . Smokeless tobacco: Never Used  . Alcohol use Yes     Comment: 2 per week  . Drug use: No  . Sexual activity: Yes    Birth control/ protection: IUD   Other Topics Concern  . Not on file   Social History Narrative   Patient is married [name]) and lives at home with her husband and child.   Patient is working full-time.   Patient has a Chief Operating Officer' degree   Patient is right-handed.   Patient drinks two cups of coffee daily.    Current Meds  Medication Sig  . carbamazepine (TEGRETOL XR) 400 MG 12 hr tablet Take 1 tablet (400 mg total) by mouth 2 (two) times daily.  Marland Kitchen levonorgestrel (MIRENA) 20 MCG/24HR IUD 1 each by Intrauterine route once.     ROS:  Review of Systems  Constitutional: Negative for fatigue, fever and unexpected weight change.  Respiratory: Negative for cough, shortness of breath and wheezing.   Cardiovascular: Negative for chest pain, palpitations and leg swelling.  Gastrointestinal: Negative for blood in stool, constipation, diarrhea, nausea and vomiting.  Endocrine: Negative for cold intolerance, heat intolerance and polyuria.  Genitourinary: Negative for dyspareunia, dysuria, flank pain, frequency, genital sores, hematuria, menstrual problem, pelvic pain, urgency, vaginal bleeding, vaginal discharge and vaginal pain.  Musculoskeletal: Negative for back pain, joint swelling and myalgias.  Skin: Negative for rash.  Neurological: Negative for dizziness, syncope, light-headedness, numbness and headaches.  Hematological: Negative for adenopathy.  Psychiatric/Behavioral: Negative for agitation, confusion, sleep disturbance and suicidal ideas. The patient is not nervous/anxious.      Objective: BP 110/70   Pulse 70   Ht 5'  11" (1.803 m)   Wt 168 lb (76.2 kg)   LMP 12/20/2016 (Exact Date)   BMI 23.43 kg/m    Physical Exam  Constitutional: She is oriented to person, place, and time. She appears well-developed and well-nourished.  Genitourinary: Vagina normal and uterus normal. There is no rash or tenderness on the right labia. There is no rash or tenderness on the left labia. No erythema or tenderness in the vagina. No vaginal discharge found. Right adnexum does not display mass and does not display tenderness. Left adnexum does not display mass and does not display tenderness.  Cervix  exhibits visible IUD strings. Cervix does not exhibit motion tenderness or polyp. Uterus is not enlarged or tender.  Neck: Normal range of motion. No thyromegaly present.  Cardiovascular: Normal rate, regular rhythm and normal heart sounds.   No murmur heard. Pulmonary/Chest: Effort normal and breath sounds normal. Right breast exhibits no mass, no nipple discharge, no skin change and no tenderness. Left breast exhibits no mass, no nipple discharge, no skin change and no tenderness.  Abdominal: Soft. There is no tenderness. There is no guarding.  Musculoskeletal: Normal range of motion.  Neurological: She is alert and oriented to person, place, and time. No cranial nerve deficit.  Psychiatric: She has a normal mood and affect. Her behavior is normal.  Vitals reviewed.    Assessment/Plan: Encounter for annual routine gynecological examination  Cervical cancer screening - Plan: IGP, Aptima HPV  Screening for HPV (human papillomavirus) - Plan: IGP, Aptima HPV  Encounter for routine checking of intrauterine contraceptive device (IUD) - IUD in placed. Due for rem 10/19/17. Pt will want another one. RTO with bleeding next yr for rem/insertion. NSAIDs. F/u prn.   Screening for breast cancer - Pt to sched mammo - Plan: MM DIGITAL SCREENING BILATERAL  Meds ordered this encounter  Medications  . levonorgestrel (MIRENA) 20 MCG/24HR IUD    Sig: 1 each by Intrauterine route once.             GYN counsel mammography screening, family planning choices, adequate intake of calcium and vitamin D, diet and exercise     F/U  Return in about 1 year (around 01/19/2018).  Joreen Swearingin B. Channah Godeaux, PA-C 01/19/2017 10:57 AM

## 2017-01-19 NOTE — Patient Instructions (Signed)
[facility]: [phone]  [facility]: [phone] 

## 2017-01-22 LAB — IGP, APTIMA HPV
HPV Aptima: NEGATIVE
PAP Smear Comment: 0

## 2017-05-17 NOTE — Progress Notes (Signed)
[facility]  PATIENT: Connie Fuentes DOB: 1975/05/18   REASON FOR VISIT: Follow-up for  seizure disorder with 3 recent seizures HISTORY FROM: Patient and Mom    HISTORY OF PRESENT ILLNESS:Connie Fuentes, 42 year old white female returns for followup.  She has a history of seizure disorder complex partial.  She has had 3 recent seizures in 2-week period of time.  She feels like there and there is due to insomnia.  She also has hot flashes that awaken her at night.  Her last seizure activity prior to recent seizures was  2014.She claims she was under minimal  stress at work . She is currently taking Tegretol-XR 400mg   twice a day, generic and thinks that her recent prescription was a different texture and may be a different manufacturer she is not sure.  Denies missing any doses of the medication. She denies any falls, any periods of daytime drowsiness, any balance issues. Her Tegretol levels at her last visit were therapeutic.  She returns for reevaluation   REVIEW OF SYSTEMS: Full 14 system review of systems performed and notable only for those listed, all others are neg:  Constitutional: neg  Cardiovascular: neg Ear/Nose/Throat: neg  Skin: neg Eyes: neg Respiratory: neg Gastroitestinal: neg  Hematology/Lymphatic: neg  Endocrine: Hot flashes Musculoskeletal:neg Allergy/Immunology: neg Neurological: 3 recent seizure events Psychiatric: neg Sleep : Frequent waking, insomnia   ALLERGIES: No Known Allergies  HOME MEDICATIONS: Outpatient Medications Prior to Visit  Medication Sig Dispense Refill  . carbamazepine (TEGRETOL XR) 400 MG 12 hr tablet Take 1 tablet (400 mg total) by mouth 2 (two) times daily. 180 tablet 3  . levonorgestrel (MIRENA) 20 MCG/24HR IUD 1 each by Intrauterine route once.     No facility-administered medications prior to visit.     PAST MEDICAL HISTORY: Past Medical History:  Diagnosis Date  . Seizure disorder (HCC)   . Seizures (HCC)      PAST SURGICAL HISTORY: Past Surgical History:  Procedure Laterality Date  . BREAST SURGERY Right 06/2010   breast biopsy; fibroadenoma  . CESAREAN SECTION    . INTRAUTERINE DEVICE (IUD) INSERTION  10/19/2012   mirena  . none      FAMILY HISTORY: Family History  Problem Relation Age of Onset  . Diabetes Mother   . Hyperlipidemia Mother   . Hypertension Mother     SOCIAL HISTORY: Social History   Socioeconomic History  . Marital status: Married    Spouse name: Connie MaduroRobert  . Number of children: 1  . Years of education: 2916  . Highest education level: Not on file  Social Needs  . Financial resource strain: Not on file  . Food insecurity - worry: Not on file  . Food insecurity - inability: Not on file  . Transportation needs - medical: Not on file  . Transportation needs - non-medical: Not on file  Occupational History    Comment: [employer]  Tobacco Use  . Smoking status: Never Smoker  . Smokeless tobacco: Never Used  Substance and Sexual Activity  . Alcohol use: Yes    Alcohol/week: 0.6 oz    Types: 1 Glasses of wine per week    Comment: 2 per week  . Drug use: No  . Sexual activity: Yes    Birth control/protection: IUD  Other Topics Concern  . Not on file  Social History Narrative   Patient is married Connie Fuentes(Connie Fuentes) and lives at home with her husband and child.   Patient is working full-time.  Patient has a Chief Operating Officer' degree   Patient is right-handed.   Patient drinks two cups of coffee daily.     PHYSICAL EXAM  Vitals:   05/18/17 0946  BP: 128/82  Pulse: 80  Weight: 167 lb 12.8 oz (76.1 kg)   Body mass index is 23.4 kg/m. General: well developed, well nourished, seated, in no evident distress Neurologic Exam  Mental Status: Awake and fully alert. Oriented to place and time. Recent and remote memory intact. Attention span, concentration, and fund of knowledge appropriate. Mood and affect appropriate. Cranial Nerves: Pupils equal, briskly reactive to  light. Extraocular movements full without nystagmus. Visual fields full to confrontation. Hearing intact and symmetric to finger snap. Facial sensation intact. Face, tongue, palate move normally and symmetrically. Neck flexion and extension normal. Motor: Normal bulk and tone. Normal strength in all tested extremity muscles. Coordination: Rapid alternating movements normal in all extremities. Finger-to-nose and heel-to-shin performed accurately bilaterally. Gait and Station: Arises from chair without difficulty. Stance is normal. Gait demonstrates normal stride length and balance. Able to heel, toe, and tandem walk without difficulty. Reflexes: 2+ and symmetric. Toes downgoing.    DIAGNOSTIC DATA (LABS, IMAGING, TESTING)   ASSESSMENT AND PLAN  42 y.o. year old female  has a past medical history of Seizure disorder (HCC) and Seizures (HCC). here to follow-up for 3 recent seizure events in the last 2 weeks.  Patient thinks that her carbamazepine XR has a different texture and therefore may be a different manufacturer she is not sure.. Last seizure activity prior was 2014.  She also complains of insomnia and hot flashes.   PLAN: Continue Tegretol at current dose for now Obtain labs today, CBC, CMP To monitor side effects of Tegretol  CBZ level to monitor for toxicity/therapeutic level  Due to recent seizures  Call for further seizure activity, keep a record Try Melantonin for sleep/insomnia F/U 6 months Nilda Riggs, [facility], [facility], APRN  [facility] [address] Connie Fuentes, [place] [address] [phone]

## 2017-05-18 ENCOUNTER — Ambulatory Visit: Payer: BC Managed Care – PPO | Admitting: Nurse Practitioner

## 2017-05-18 ENCOUNTER — Encounter: Payer: Self-pay | Admitting: Nurse Practitioner

## 2017-05-18 VITALS — BP 128/82 | HR 80 | Wt 167.8 lb

## 2017-05-18 DIAGNOSIS — G40209 Localization-related (focal) (partial) symptomatic epilepsy and epileptic syndromes with complex partial seizures, not intractable, without status epilepticus: Secondary | ICD-10-CM | POA: Diagnosis not present

## 2017-05-18 DIAGNOSIS — Z5181 Encounter for therapeutic drug level monitoring: Secondary | ICD-10-CM

## 2017-05-18 DIAGNOSIS — G4700 Insomnia, unspecified: Secondary | ICD-10-CM | POA: Diagnosis not present

## 2017-05-18 NOTE — Patient Instructions (Signed)
Continue Tegretol at current dose for now Obtain labs today, CBC, CMP To monitor side effects of Tegretol  CBZ level to monitor for toxicity/therapeutic level recent seizures  Call for seizure activity Try Melantonin for sleep F/U 6 months

## 2017-05-19 ENCOUNTER — Telehealth: Payer: Self-pay | Admitting: Nurse Practitioner

## 2017-05-19 LAB — CBC WITH DIFFERENTIAL/PLATELET
Basophils Absolute: 0 10*3/uL (ref 0.0–0.2)
Basos: 1 %
EOS (ABSOLUTE): 0.1 10*3/uL (ref 0.0–0.4)
Eos: 1 %
Hematocrit: 41.1 % (ref 34.0–46.6)
Hemoglobin: 13.7 g/dL (ref 11.1–15.9)
Immature Grans (Abs): 0 10*3/uL (ref 0.0–0.1)
Immature Granulocytes: 0 %
Lymphocytes Absolute: 1.9 10*3/uL (ref 0.7–3.1)
Lymphs: 34 %
MCH: 31.1 pg (ref 26.6–33.0)
MCHC: 33.3 g/dL (ref 31.5–35.7)
MCV: 93 fL (ref 79–97)
Monocytes Absolute: 0.5 10*3/uL (ref 0.1–0.9)
Monocytes: 10 %
Neutrophils Absolute: 3.1 10*3/uL (ref 1.4–7.0)
Neutrophils: 54 %
Platelets: 266 10*3/uL (ref 150–379)
RBC: 4.4 x10E6/uL (ref 3.77–5.28)
RDW: 13.2 % (ref 12.3–15.4)
WBC: 5.6 10*3/uL (ref 3.4–10.8)

## 2017-05-19 LAB — COMPREHENSIVE METABOLIC PANEL
ALT: 37 IU/L — ABNORMAL HIGH (ref 0–32)
AST: 29 IU/L (ref 0–40)
Albumin/Globulin Ratio: 1.8 (ref 1.2–2.2)
Albumin: 4.5 g/dL (ref 3.5–5.5)
Alkaline Phosphatase: 56 IU/L (ref 39–117)
BUN/Creatinine Ratio: 16 (ref 9–23)
BUN: 10 mg/dL (ref 6–24)
Bilirubin Total: 0.2 mg/dL (ref 0.0–1.2)
CO2: 22 mmol/L (ref 20–29)
Calcium: 9.5 mg/dL (ref 8.7–10.2)
Chloride: 99 mmol/L (ref 96–106)
Creatinine, Ser: 0.63 mg/dL (ref 0.57–1.00)
GFR calc Af Amer: 130 mL/min/{1.73_m2} (ref >59)
GFR calc non Af Amer: 113 mL/min/{1.73_m2} (ref >59)
Globulin, Total: 2.5 g/dL (ref 1.5–4.5)
Glucose: 75 mg/dL (ref 65–99)
Potassium: 4.1 mmol/L (ref 3.5–5.2)
Sodium: 138 mmol/L (ref 134–144)
Total Protein: 7 g/dL (ref 6.0–8.5)

## 2017-05-19 LAB — CARBAMAZEPINE LEVEL, TOTAL: Carbamazepine (Tegretol), S: 8.1 ug/mL (ref 4.0–12.0)

## 2017-05-19 NOTE — Telephone Encounter (Signed)
Left vm for patients husband cell number who is on dpr. Rn left message that pt needs to call office about medication adjustment,and lab work results. Rn explain lab work results are in.

## 2017-05-19 NOTE — Telephone Encounter (Signed)
Telephone call to patient unable to leave message due to mailbox being full.  Carbamazepine level is therapeutic at 8.1 which is better than it was when last checked in September.  Because she has had 3 recent seizures we will add an additional 100 mg of Tegretol-XR to take twice a day along with other dose of Tegretol.  Please let us know where you want this sent. Katrina please Try to call the patient again and give her the above message

## 2017-05-19 NOTE — Telephone Encounter (Signed)
Rn call patients cell number unable to leave message. Pts vm is full. Will call husband number.

## 2017-05-20 MED ORDER — CARBAMAZEPINE ER 100 MG PO TB12: 100.0000 mg | ORAL_TABLET | Freq: Two times a day (BID) | ORAL | 6 refills | Status: DC

## 2017-05-20 NOTE — Addendum Note (Signed)
Addended by: Lynder ParentsMARTIN, NANCY C on: 05/20/2017 03:47 PM   Modules accepted: Orders

## 2017-05-20 NOTE — Telephone Encounter (Signed)
Pt returning call

## 2017-05-20 NOTE — Progress Notes (Signed)
I have reviewed and agreed above plan. 

## 2017-05-20 NOTE — Telephone Encounter (Signed)
Rn return patients call about her medication adjustment, and her levels being 8.1. Rn stated because of he recent 3 seizures Eber JonesCarolyn is adding 100mg  tegretol XR twice a day, along with her other dosage of tegretol. PT [facility] on [facility] profile. Rn will send message to [facility] NP.

## 2017-06-24 ENCOUNTER — Telehealth: Payer: Self-pay | Admitting: *Deleted

## 2017-06-24 NOTE — Telephone Encounter (Signed)
[facility], re: [facility]ol XR, [facility] explain the [facility]. She stated that patient's new  insurance will cover brand Tegretol if DAW is on Rx. This RN explained that patient has been on generic for several years. Edy stated that her insurance needs the Rx to state generic specifically. She annotated the Rx for generic and stated she will run it through her insurance again. She verbalized understanding, appreciation of call.

## 2017-09-09 ENCOUNTER — Other Ambulatory Visit: Payer: Self-pay | Admitting: Neurology

## 2017-09-20 ENCOUNTER — Telehealth: Payer: Self-pay | Admitting: Obstetrics and Gynecology

## 2017-09-20 NOTE — Telephone Encounter (Signed)
Patient scheduled 6/17 for mirena replacement with ABC

## 2017-09-21 NOTE — Telephone Encounter (Signed)
Noted. Will order to arrive by apt date/time. 

## 2017-09-22 ENCOUNTER — Other Ambulatory Visit: Payer: Self-pay | Admitting: Nurse Practitioner

## 2017-09-27 ENCOUNTER — Ambulatory Visit (INDEPENDENT_AMBULATORY_CARE_PROVIDER_SITE_OTHER): Payer: BC Managed Care – PPO | Admitting: Obstetrics and Gynecology

## 2017-09-27 ENCOUNTER — Encounter: Payer: Self-pay | Admitting: Obstetrics and Gynecology

## 2017-09-27 VITALS — BP 110/68 | HR 75 | Ht 71.0 in | Wt 166.5 lb

## 2017-09-27 DIAGNOSIS — Z30433 Encounter for removal and reinsertion of intrauterine contraceptive device: Secondary | ICD-10-CM | POA: Diagnosis not present

## 2017-09-27 MED ORDER — LEVONORGESTREL 20 MCG/24HR IU IUD: 1.0000 | INTRAUTERINE_SYSTEM | Freq: Once | INTRAUTERINE | 0 refills | Status: AC

## 2017-09-27 NOTE — Patient Instructions (Signed)
I value your feedback and entrusting us with your care. If you get a [facility] patient survey, I would appreciate you taking the time to let us know about your experience today. Thank you!  [facility] [phone]  Instructions after IUD insertion  Most women experience no significant problems after insertion of an IUD, however minor cramping and spotting for a few days is common. Cramps may be treated with ibuprofen 800mg every 8 hours or Tylenol 650 mg every 4 hours. Contact [facility] immediately if you experience any of the following symptoms during the next week: temperature >99.6 degrees, worsening pelvic pain, abdominal pain, fainting, unusually heavy vaginal bleeding, foul vaginal discharge, or if you think you have expelled the IUD.  Nothing inserted in the vagina for 48 hours. You will be scheduled for a follow up visit in approximately four weeks.  You should check monthly to be sure you can feel the IUD strings in the upper vagina. If you are having a monthly period, try to check after each period. If you cannot feel the IUD strings,  contact [facility] immediately so we can do an exam to determine if the IUD has been expelled.   Please use backup protection until we can confirm the IUD is in place.  Call [facility] if you are exposed to or diagnosed with a sexually transmitted infection, as we will need to discuss whether it is safe for you to continue using an IUD.   

## 2017-09-27 NOTE — Progress Notes (Signed)
   Chief Complaint  Patient presents with  . Contraception    IUD replacement      History of Present Illness:  Connie Fuentes is a 42 y.o. that had a Mirena IUD placed approximately 5 years ago. Since that time, she denies dyspareunia, pelvic pain, non-menstrual bleeding, vaginal d/c, heavy bleeding. [facility]   BP 110/68   Pulse 75   Ht 5\' 11"  (1.803 m)   Wt 166 lb 8 oz (75.5 kg)   BMI 23.22 kg/m   Pelvic exam:  Two IUD strings present seen coming from the cervical os. EGBUS, vaginal vault and cervix: within normal limits  IUD Removal Strings of IUD identified and grasped.  IUD removed without problem with ring forceps.  Pt tolerated this well.  IUD noted to be intact.   IUD Insertion Procedure Note Patient identified, informed consent performed, consent signed.   Discussed risks of irregular bleeding, cramping, infection, malpositioning or misplacement of the IUD outside the uterus which may require further procedure such as laparoscopy, risk of failure <1%. Time out was performed.    Speculum placed in the vagina.  Cervix visualized.  Cleaned with Betadine x 2.  Grasped anteriorly with a single tooth tenaculum.  Uterus sounded to 8.5 cm.   IUD placed per manufacturer's recommendations.  Strings trimmed to 3 cm. Tenaculum was removed, good hemostasis noted.  Patient tolerated procedure well.   ASSESSMENT:  Encounter for removal and reinsertion of intrauterine contraceptive device (IUD) - Plan: levonorgestrel (MIRENA) 20 MCG/24HR IUD   Meds ordered this encounter  Medications  . levonorgestrel (MIRENA) 20 MCG/24HR IUD    Sig: 1 Intra Uterine Device (1 each total) by Intrauterine route once for 1 dose.    Dispense:  1 each    Refill:  0    Order Specific Question:   Supervising Provider    Answer:   Nadara MustardHARRIS, ROBERT P [[id number]]     Plan:  Patient was given post-procedure instructions.  She was advised to have backup contraception for one week.     Call if you are having increasing pain, cramps or bleeding or if you have a fever greater than 100.4 degrees F., shaking chills, nausea or vomiting. Patient was also asked to check IUD strings periodically and follow up in 4 weeks for IUD check.  Return in about 1 month (around 10/25/2017) for IUD f/u.  Alicia B. Copland, PA-C 09/27/2017 9:14 AM

## 2017-10-16 ENCOUNTER — Other Ambulatory Visit: Payer: Self-pay | Admitting: Nurse Practitioner

## 2017-10-20 ENCOUNTER — Other Ambulatory Visit: Payer: Self-pay | Admitting: Nurse Practitioner

## 2017-10-20 NOTE — Telephone Encounter (Signed)
[facility], spoke with Shaun, pharmacist. He stated the carbamazepine 100 mg tabs are on back order. He would like to switch patient to carbamazepine 200 mg tabs, take 1/2 tab every 12 hours. This RN gave verbal per Enid Skeens Martin, NP that he may switch. He will refill for 90 day supply with 3 refills, the same as refill ordered by NP on 09/22/17. He verbalized understanding, appreciation of call. Electronic refill not needed.

## 2017-10-25 ENCOUNTER — Ambulatory Visit (INDEPENDENT_AMBULATORY_CARE_PROVIDER_SITE_OTHER): Payer: BC Managed Care – PPO | Admitting: Obstetrics and Gynecology

## 2017-10-25 ENCOUNTER — Encounter: Payer: Self-pay | Admitting: Obstetrics and Gynecology

## 2017-10-25 VITALS — BP 110/72 | HR 81 | Ht 71.0 in | Wt 164.0 lb

## 2017-10-25 DIAGNOSIS — Z30431 Encounter for routine checking of intrauterine contraceptive device: Secondary | ICD-10-CM

## 2017-10-25 NOTE — Patient Instructions (Signed)
I value your feedback and entrusting us with your care. If you get a [facility] patient survey, I would appreciate you taking the time to let us know about your experience today. Thank you! 

## 2017-10-25 NOTE — Progress Notes (Signed)
   Chief Complaint  Patient presents with  . Follow-up    IUD check      History of Present Illness:  Connie Fuentes is a 42 y.o. that had a Mirena IUD placed approximately 1 month ago. Since that time, she denies dyspareunia, pelvic pain,  vaginal d/c, heavy bleeding. She has had some BTB/spotting.  Review of Systems  Constitutional: Negative for fever.  Gastrointestinal: Negative for blood in stool, constipation, diarrhea, nausea and vomiting.  Genitourinary: Positive for vaginal bleeding. Negative for dyspareunia, dysuria, flank pain, frequency, hematuria, urgency, vaginal discharge and vaginal pain.  Musculoskeletal: Negative for back pain.  Skin: Negative for rash.    Physical Exam:  BP 110/72   Pulse 81   Ht 5\' 11"  (1.803 m)   Wt 164 lb (74.4 kg)   BMI 22.87 kg/m  Body mass index is 22.87 kg/m.  Pelvic exam:  Two IUD strings present seen coming from the cervical os. EGBUS, vaginal vault and cervix: within normal limits   Assessment:   Encounter for routine checking of intrauterine contraceptive device (IUD)  IUD strings present in proper location; pt doing well  Plan: F/u if any signs of infection or can no longer feel the strings.   Connie Moger B. Alaiah Lundy, PA-C 10/25/2017 9:08 AM

## 2017-11-02 NOTE — Telephone Encounter (Signed)
Mirena rcvd/charged 09/27/17

## 2017-11-16 ENCOUNTER — Ambulatory Visit: Payer: BC Managed Care – PPO | Admitting: Nurse Practitioner

## 2017-12-23 ENCOUNTER — Ambulatory Visit: Payer: BC Managed Care – PPO | Admitting: Nurse Practitioner

## 2018-01-03 ENCOUNTER — Telehealth: Payer: Self-pay | Admitting: Nurse Practitioner

## 2018-01-03 MED ORDER — CARBAMAZEPINE ER 400 MG PO TB12: 400.0000 mg | ORAL_TABLET | Freq: Two times a day (BID) | ORAL | 3 refills | Status: DC

## 2018-01-03 NOTE — Telephone Encounter (Signed)
Pt requesting a refills for   carbamazepine (TEGRETOL XR) 400 MG 12 hr tablet  [facility] [place]

## 2018-01-20 ENCOUNTER — Ambulatory Visit (INDEPENDENT_AMBULATORY_CARE_PROVIDER_SITE_OTHER): Payer: BC Managed Care – PPO | Admitting: Obstetrics and Gynecology

## 2018-01-20 ENCOUNTER — Encounter: Payer: Self-pay | Admitting: Obstetrics and Gynecology

## 2018-01-20 VITALS — BP 110/70 | HR 69 | Ht 71.0 in | Wt 171.0 lb

## 2018-01-20 DIAGNOSIS — Z30431 Encounter for routine checking of intrauterine contraceptive device: Secondary | ICD-10-CM

## 2018-01-20 DIAGNOSIS — Z01419 Encounter for gynecological examination (general) (routine) without abnormal findings: Secondary | ICD-10-CM | POA: Diagnosis not present

## 2018-01-20 DIAGNOSIS — Z1239 Encounter for other screening for malignant neoplasm of breast: Secondary | ICD-10-CM | POA: Diagnosis not present

## 2018-01-20 DIAGNOSIS — Z0000 Encounter for general adult medical examination without abnormal findings: Secondary | ICD-10-CM

## 2018-01-20 NOTE — Patient Instructions (Signed)
I value your feedback and entrusting us with your care. If you get a [facility] patient survey, I would appreciate you taking the time to let us know about your experience today. Thank you!  [facility]: [phone]  [facility]: [phone]  

## 2018-01-20 NOTE — Progress Notes (Signed)
PCP:  Patient, No Pcp Per   Chief Complaint  Patient presents with  . Gynecologic Exam     HPI:      [name] is a 42 y.o. G1P1001 who LMP was No LMP recorded. (Menstrual status: IUD)., presents today for her annual examination.  Her menses are absent due to IUD. Dysmenorrhea none. She does have occas intermenstrual bleeding.  Sex activity: single partner, contraception - IUD. Mirena replaced 09/27/17.  Last Pap: 01/19/17  Results were: no abnormalities /neg HPV DNA  Hx of STDs: none  Last mammo: 2013 There is no FH of breast cancer. There is no FH of ovarian cancer. The patient does do self-breast exams.  Tobacco use: The patient denies current or previous tobacco use. Alcohol use: none No drug use.  Exercise: moderately active  She does get adequate calcium and Vitamin D in her diet.  No recent labs. Not fasting today. Prefers to check next yr.  Past Medical History:  Diagnosis Date  . Fibroadenosis of breast    06/2010,08/2011;RT. Dr. Lemar Livings  . Seizure disorder (HCC)   . Seizures (HCC)     Past Surgical History:  Procedure Laterality Date  . BREAST SURGERY Right 06/2010   breast biopsy; fibroadenoma  . CESAREAN SECTION    . INTRAUTERINE DEVICE (IUD) INSERTION  10/19/2012   mirena  . none      Family History  Problem Relation Age of Onset  . Diabetes Mother   . Hyperlipidemia Mother   . Hypertension Mother     Social History   Socioeconomic History  . Marital status: Married    Spouse name: [name]  . Number of children: 1  . Years of education: 24  . Highest education level: Not on file  Occupational History    Comment: [employer]  Social Needs  . Financial resource strain: Not on file  . Food insecurity:    Worry: Not on file    Inability: Not on file  . Transportation needs:    Medical: Not on file    Non-medical: Not on file  Tobacco Use  . Smoking status: Never Smoker  . Smokeless tobacco: Never Used  Substance and Sexual Activity   . Alcohol use: Not Currently  . Drug use: No  . Sexual activity: Yes    Birth control/protection: IUD    Comment: Mirena   Lifestyle  . Physical activity:    Days per week: Not on file    Minutes per session: Not on file  . Stress: Not on file  Relationships  . Social connections:    Talks on phone: Not on file    Gets together: Not on file    Attends religious service: Not on file    Active member of club or organization: Not on file    Attends meetings of clubs or organizations: Not on file    Relationship status: Not on file  . Intimate partner violence:    Fear of current or ex partner: Not on file    Emotionally abused: Not on file    Physically abused: Not on file    Forced sexual activity: Not on file  Other Topics Concern  . Not on file  Social History Narrative   Patient is married [name]) and lives at home with her husband and child.   Patient is working full-time.   Patient has a Chief Operating Officer' degree   Patient is right-handed.   Patient drinks two cups of coffee daily.  Current Meds  Medication Sig  . carbamazepine (TEGRETOL XR) 100 MG 12 hr tablet TAKE 1 TABLET BY MOUTH TWICE A DAY  . carbamazepine (TEGRETOL XR) 400 MG 12 hr tablet Take 1 tablet (400 mg total) by mouth 2 (two) times daily.     ROS:  Review of Systems  Constitutional: Negative for fatigue, fever and unexpected weight change.  Respiratory: Negative for cough, shortness of breath and wheezing.   Cardiovascular: Negative for chest pain, palpitations and leg swelling.  Gastrointestinal: Negative for blood in stool, constipation, diarrhea, nausea and vomiting.  Endocrine: Negative for cold intolerance, heat intolerance and polyuria.  Genitourinary: Negative for dyspareunia, dysuria, flank pain, frequency, genital sores, hematuria, menstrual problem, pelvic pain, urgency, vaginal bleeding, vaginal discharge and vaginal pain.  Musculoskeletal: Negative for back pain, joint swelling and myalgias.   Skin: Negative for rash.  Neurological: Negative for dizziness, syncope, light-headedness, numbness and headaches.  Hematological: Negative for adenopathy.  Psychiatric/Behavioral: Negative for agitation, confusion, sleep disturbance and suicidal ideas. The patient is not nervous/anxious.      Objective: BP 110/70   Pulse 69   Ht 5\' 11"  (1.803 m)   Wt 171 lb (77.6 kg)   BMI 23.85 kg/m    Physical Exam  Constitutional: She is oriented to person, place, and time. She appears well-developed and well-nourished.  Genitourinary: Vagina normal and uterus normal. There is no rash or tenderness on the right labia. There is no rash or tenderness on the left labia. No erythema or tenderness in the vagina. No vaginal discharge found. Right adnexum does not display mass and does not display tenderness. Left adnexum does not display mass and does not display tenderness.  Cervix exhibits visible IUD strings. Cervix does not exhibit motion tenderness or polyp. Uterus is not enlarged or tender.  Neck: Normal range of motion. No thyromegaly present.  Cardiovascular: Normal rate, regular rhythm and normal heart sounds.  No murmur heard. Pulmonary/Chest: Effort normal and breath sounds normal. Right breast exhibits no mass, no nipple discharge, no skin change and no tenderness. Left breast exhibits no mass, no nipple discharge, no skin change and no tenderness.  Abdominal: Soft. There is no tenderness. There is no guarding.  Musculoskeletal: Normal range of motion.  Neurological: She is alert and oriented to person, place, and time. No cranial nerve deficit.  Psychiatric: She has a normal mood and affect. Her behavior is normal.  Vitals reviewed.    Assessment/Plan: Encounter for annual routine gynecological examination  Screening for breast cancer - Plan: MM 3D SCREEN BREAST BILATERAL  Encounter for routine checking of intrauterine contraceptive device (IUD) - IUD in place  Blood tests for  routine general physical examination - Will do fasting labs next yr.       GYN counsel mammography screening, adequate intake of calcium and vitamin D, diet and exercise     F/U  Return in about 1 year (around 01/21/2019).  Alicia B. Copland, PA-C 01/20/2018 8:53 AM

## 2018-05-02 NOTE — Progress Notes (Signed)
[facility]  PATIENT: Connie Fuentes DOB: December 03, 1975   REASON FOR VISIT: Follow-up for  seizure disorder  HISTORY FROM: Patient     HISTORY OF PRESENT ILLNESS:UPDATE 1/21/2020CM Connie Fuentes, 43 year old female returns for follow-up with history of complex partial seizure disorder.  She denies any seizures since last seen.  He remains on Tegretol extended release 500 mg twice daily.  She denies any falls any  balance issues.  She denies any daytime drowsiness.  She has had no interval medical issues.  She returns for reevaluation.  She continues to work full-time    2/5/19CMMs. Connie Fuentes, 43 year old white female returns for followup.  She has a history of seizure disorder complex partial.  She has had 3 recent seizures in 2-week period of time.  She feels like there and there is due to insomnia.  She also has hot flashes that awaken her at night.  Her last seizure activity prior to recent seizures was  2014.She claims she was under minimal  stress at work . She is currently taking Tegretol-XR 400mg   twice a day, generic and thinks that her recent prescription was a different texture and may be a different manufacturer she is not sure.  Denies missing any doses of the medication. She denies any falls, any periods of daytime drowsiness, any balance issues. Her Tegretol levels at her last visit were therapeutic.  She returns for reevaluation   REVIEW OF SYSTEMS: Full 14 system review of systems performed and notable only for those listed, all others are neg:  Constitutional: neg  Cardiovascular: neg Ear/Nose/Throat: neg  Skin: neg Eyes: neg Respiratory: neg Gastroitestinal: neg  Hematology/Lymphatic: neg  Endocrine: Hot flashes Musculoskeletal:neg Allergy/Immunology: neg Neurological: Seizure disorder Psychiatric: neg Sleep : neg   ALLERGIES: No Known Allergies  HOME MEDICATIONS: Outpatient Medications Prior to Visit  Medication Sig Dispense Refill  .  carbamazepine (TEGRETOL XR) 100 MG 12 hr tablet TAKE 1 TABLET BY MOUTH TWICE A DAY 180 tablet 3  . carbamazepine (TEGRETOL XR) 400 MG 12 hr tablet Take 1 tablet (400 mg total) by mouth 2 (two) times daily. 180 tablet 3  . levonorgestrel (MIRENA) 20 MCG/24HR IUD 1 Intra Uterine Device (1 each total) by Intrauterine route once for 1 dose. 1 each 0   No facility-administered medications prior to visit.     PAST MEDICAL HISTORY: Past Medical History:  Diagnosis Date  . Fibroadenosis of breast    06/2010,08/2011;RT. Dr. Lemar Livings  . Seizure disorder (HCC)   . Seizures (HCC)     PAST SURGICAL HISTORY: Past Surgical History:  Procedure Laterality Date  . BREAST SURGERY Right 06/2010   breast biopsy; fibroadenoma  . CESAREAN SECTION    . INTRAUTERINE DEVICE (IUD) INSERTION  10/19/2012   mirena  . none      FAMILY HISTORY: Family History  Problem Relation Age of Onset  . Diabetes Mother   . Hyperlipidemia Mother   . Hypertension Mother     SOCIAL HISTORY: Social History   Socioeconomic History  . Marital status: Married    Spouse name: [name]  . Number of children: 1  . Years of education: 9  . Highest education level: Not on file  Occupational History    Comment: [employer]  Social Needs  . Financial resource strain: Not on file  . Food insecurity:    Worry: Not on file    Inability: Not on file  . Transportation needs:    Medical: Not on file  Non-medical: Not on file  Tobacco Use  . Smoking status: Never Smoker  . Smokeless tobacco: Never Used  Substance and Sexual Activity  . Alcohol use: Not Currently  . Drug use: No  . Sexual activity: Yes    Birth control/protection: I.U.D.    Comment: Mirena   Lifestyle  . Physical activity:    Days per week: Not on file    Minutes per session: Not on file  . Stress: Not on file  Relationships  . Social connections:    Talks on phone: Not on file    Gets together: Not on file    Attends religious service: Not on file      Active member of club or organization: Not on file    Attends meetings of clubs or organizations: Not on file    Relationship status: Not on file  . Intimate partner violence:    Fear of current or ex partner: Not on file    Emotionally abused: Not on file    Physically abused: Not on file    Forced sexual activity: Not on file  Other Topics Concern  . Not on file  Social History Narrative   Patient is married [name]) and lives at home with her husband and child.   Patient is working full-time.   Patient has a Chief Operating Officer' degree   Patient is right-handed.   Patient drinks two cups of coffee daily.     PHYSICAL EXAM  Vitals:   05/03/18 0828  BP: 124/82  Pulse: 70  Resp: 14  Weight: 172 lb 8 oz (78.2 kg)  Height: 5\' 11"  (1.803 m)   Body mass index is 24.06 kg/m. General: well developed, well nourished, seated, in no evident distress Neurologic Exam  Mental Status: Awake and fully alert. Oriented to place and time. Recent and remote memory intact. Attention span, concentration, and fund of knowledge appropriate. Mood and affect appropriate. Cranial Nerves: Pupils equal, briskly reactive to light. Extraocular movements full without nystagmus. Visual fields full to confrontation. Hearing intact and symmetric to finger snap. Facial sensation intact. Face, tongue, palate move normally and symmetrically. Neck flexion and extension normal. Motor: Normal bulk and tone. Normal strength in all tested extremity muscles. Sensory intact to touch on the face arms and legs Coordination: Rapid alternating movements normal in all extremities. Finger-to-nose and heel-to-shin performed accurately bilaterally. Gait and Station: Arises from chair without difficulty. Stance is normal. Gait demonstrates normal stride length and balance. Able to heel, toe, and tandem walk without difficulty. Reflexes: 2+ and symmetric. Toes downgoing.    DIAGNOSTIC DATA (LABS, IMAGING,  TESTING)   ASSESSMENT AND PLAN  43 y.o. year old female  has a past medical history of Fibroadenosis of breast, Seizure disorder (HCC), and Seizures (HCC). here to follow-up for complex partial seizure disorder currently well controlled on Tegretol XR.  PLAN: Continue Tegretol at current dose for now will refill when labs back Obtain labs today, CBC, CMP To monitor side effects of Tegretol  CBZ level to monitor for toxicity/therapeutic level    Call for further seizure activity,  F/U yearly Nilda Riggs, [facility], APRN  [facility] [address], [address] [place], [place] [address] [phone]

## 2018-05-03 ENCOUNTER — Ambulatory Visit: Payer: BC Managed Care – PPO | Admitting: Nurse Practitioner

## 2018-05-03 ENCOUNTER — Other Ambulatory Visit: Payer: Self-pay

## 2018-05-03 ENCOUNTER — Encounter: Payer: Self-pay | Admitting: Nurse Practitioner

## 2018-05-03 VITALS — BP 124/82 | HR 70 | Resp 14 | Ht 71.0 in | Wt 172.5 lb

## 2018-05-03 DIAGNOSIS — G40209 Localization-related (focal) (partial) symptomatic epilepsy and epileptic syndromes with complex partial seizures, not intractable, without status epilepticus: Secondary | ICD-10-CM

## 2018-05-03 DIAGNOSIS — Z5181 Encounter for therapeutic drug level monitoring: Secondary | ICD-10-CM

## 2018-05-03 NOTE — Progress Notes (Signed)
I have read the note, and I agree with the clinical assessment and plan.  Charles K Willis   

## 2018-05-03 NOTE — Patient Instructions (Signed)
Continue Tegretol at current dose for now Obtain labs today, CBC, CMP To monitor side effects of Tegretol  CBZ level to monitor for toxicity/therapeutic level    Call for further seizure activity,  F/U yearly

## 2018-05-04 ENCOUNTER — Other Ambulatory Visit: Payer: Self-pay | Admitting: Nurse Practitioner

## 2018-05-04 ENCOUNTER — Encounter: Payer: Self-pay | Admitting: *Deleted

## 2018-05-04 LAB — CBC WITH DIFFERENTIAL/PLATELET
Basophils Absolute: 0 10*3/uL (ref 0.0–0.2)
Basos: 1 %
EOS (ABSOLUTE): 0.2 10*3/uL (ref 0.0–0.4)
Eos: 4 %
Hematocrit: 39.5 % (ref 34.0–46.6)
Hemoglobin: 13.6 g/dL (ref 11.1–15.9)
Immature Grans (Abs): 0 10*3/uL (ref 0.0–0.1)
Immature Granulocytes: 0 %
Lymphocytes Absolute: 1.7 10*3/uL (ref 0.7–3.1)
Lymphs: 34 %
MCH: 31.9 pg (ref 26.6–33.0)
MCHC: 34.4 g/dL (ref 31.5–35.7)
MCV: 93 fL (ref 79–97)
Monocytes Absolute: 0.6 10*3/uL (ref 0.1–0.9)
Monocytes: 11 %
Neutrophils Absolute: 2.6 10*3/uL (ref 1.4–7.0)
Neutrophils: 50 %
Platelets: 279 10*3/uL (ref 150–450)
RBC: 4.27 x10E6/uL (ref 3.77–5.28)
RDW: 11.8 % (ref 11.7–15.4)
WBC: 5.2 10*3/uL (ref 3.4–10.8)

## 2018-05-04 LAB — COMPREHENSIVE METABOLIC PANEL
ALT: 36 IU/L — ABNORMAL HIGH (ref 0–32)
AST: 32 IU/L (ref 0–40)
Albumin/Globulin Ratio: 2 (ref 1.2–2.2)
Albumin: 4.5 g/dL (ref 3.8–4.8)
Alkaline Phosphatase: 68 IU/L (ref 39–117)
BUN/Creatinine Ratio: 22 (ref 9–23)
BUN: 11 mg/dL (ref 6–24)
Bilirubin Total: 0.4 mg/dL (ref 0.0–1.2)
CO2: 26 mmol/L (ref 20–29)
Calcium: 9.5 mg/dL (ref 8.7–10.2)
Chloride: 99 mmol/L (ref 96–106)
Creatinine, Ser: 0.51 mg/dL — ABNORMAL LOW (ref 0.57–1.00)
GFR calc Af Amer: 138 mL/min/{1.73_m2} (ref >59)
GFR calc non Af Amer: 120 mL/min/{1.73_m2} (ref >59)
Globulin, Total: 2.3 g/dL (ref 1.5–4.5)
Glucose: 71 mg/dL (ref 65–99)
Potassium: 4.4 mmol/L (ref 3.5–5.2)
Sodium: 140 mmol/L (ref 134–144)
Total Protein: 6.8 g/dL (ref 6.0–8.5)

## 2018-05-04 LAB — CARBAMAZEPINE LEVEL, TOTAL: Carbamazepine (Tegretol), S: 11.5 ug/mL (ref 4.0–12.0)

## 2018-05-04 MED ORDER — CARBAMAZEPINE ER 400 MG PO TB12: 400.0000 mg | ORAL_TABLET | Freq: Two times a day (BID) | ORAL | 3 refills | Status: DC

## 2018-05-04 MED ORDER — CARBAMAZEPINE ER 100 MG PO TB12: 100.0000 mg | ORAL_TABLET | Freq: Two times a day (BID) | ORAL | 3 refills | Status: DC

## 2018-10-04 ENCOUNTER — Telehealth: Payer: Self-pay

## 2018-10-04 MED ORDER — CARBAMAZEPINE ER 100 MG PO TB12: 100.0000 mg | ORAL_TABLET | Freq: Two times a day (BID) | ORAL | 3 refills | Status: DC

## 2018-10-04 NOTE — Telephone Encounter (Signed)
Received a fax from [facility] for Carbamazepine ER 200 MG Tablet. But its not on PTs current med list Please advise.

## 2018-10-04 NOTE — Telephone Encounter (Signed)
I called the patient.  The patient claims that when she went to the pharmacy last time, they did not have the Tegretol XR 100 mg tablet, they gave her the 200 mg IR tablet however break the tablet in half taking 1/2 tablet twice daily.  I will call in a prescription for the 100 mg XR tablet.

## 2019-01-22 NOTE — Progress Notes (Deleted)
PCP:  Patient, No Pcp Per   No chief complaint on file.    HPI:      [name] is a 43 y.o. G1P1001 who LMP was No LMP recorded. (Menstrual status: IUD)., presents today for her annual examination.  Her menses are absent due to IUD. Dysmenorrhea none. She does have occas intermenstrual bleeding.  Sex activity: single partner, contraception - IUD. Mirena replaced 09/27/17.  Last Pap: 01/19/17  Results were: no abnormalities /neg HPV DNA  Hx of STDs: none  Last mammo: 2013 There is no FH of breast cancer. There is no FH of ovarian cancer. The patient does do self-breast exams.  Tobacco use: The patient denies current or previous tobacco use. Alcohol use: none No drug use.  Exercise: moderately active  She does get adequate calcium and Vitamin D in her diet.  No recent labs. Not fasting today. Prefers to check next yr.  Past Medical History:  Diagnosis Date  . Fibroadenosis of breast    06/2010,08/2011;RT. Dr. Lemar Livings  . Seizure disorder (HCC)   . Seizures (HCC)     Past Surgical History:  Procedure Laterality Date  . BREAST SURGERY Right 06/2010   breast biopsy; fibroadenoma  . CESAREAN SECTION    . INTRAUTERINE DEVICE (IUD) INSERTION  10/19/2012   mirena  . none      Family History  Problem Relation Age of Onset  . Diabetes Mother   . Hyperlipidemia Mother   . Hypertension Mother     Social History   Socioeconomic History  . Marital status: Married    Spouse name: [name]  . Number of children: 1  . Years of education: 60  . Highest education level: Not on file  Occupational History    Comment: [employer]  Social Needs  . Financial resource strain: Not on file  . Food insecurity    Worry: Not on file    Inability: Not on file  . Transportation needs    Medical: Not on file    Non-medical: Not on file  Tobacco Use  . Smoking status: Never Smoker  . Smokeless tobacco: Never Used  Substance and Sexual Activity  . Alcohol use: Not Currently  .  Drug use: No  . Sexual activity: Yes    Birth control/protection: I.U.D.    Comment: Mirena   Lifestyle  . Physical activity    Days per week: Not on file    Minutes per session: Not on file  . Stress: Not on file  Relationships  . Social Musician on phone: Not on file    Gets together: Not on file    Attends religious service: Not on file    Active member of club or organization: Not on file    Attends meetings of clubs or organizations: Not on file    Relationship status: Not on file  . Intimate partner violence    Fear of current or ex partner: Not on file    Emotionally abused: Not on file    Physically abused: Not on file    Forced sexual activity: Not on file  Other Topics Concern  . Not on file  Social History Narrative   Patient is married [name]) and lives at home with her husband and child.   Patient is working full-time.   Patient has a Chief Operating Officer' degree   Patient is right-handed.   Patient drinks two cups of coffee daily.    No outpatient medications  have been marked as taking for the 01/23/19 encounter (Appointment) with Mikiyah Glasner, Deirdre Evener, PA-C.     ROS:  Review of Systems  Constitutional: Negative for fatigue, fever and unexpected weight change.  Respiratory: Negative for cough, shortness of breath and wheezing.   Cardiovascular: Negative for chest pain, palpitations and leg swelling.  Gastrointestinal: Negative for blood in stool, constipation, diarrhea, nausea and vomiting.  Endocrine: Negative for cold intolerance, heat intolerance and polyuria.  Genitourinary: Negative for dyspareunia, dysuria, flank pain, frequency, genital sores, hematuria, menstrual problem, pelvic pain, urgency, vaginal bleeding, vaginal discharge and vaginal pain.  Musculoskeletal: Negative for back pain, joint swelling and myalgias.  Skin: Negative for rash.  Neurological: Negative for dizziness, syncope, light-headedness, numbness and headaches.  Hematological:  Negative for adenopathy.  Psychiatric/Behavioral: Negative for agitation, confusion, sleep disturbance and suicidal ideas. The patient is not nervous/anxious.      Objective: There were no vitals taken for this visit.   Physical Exam Constitutional:      Appearance: She is well-developed.  Genitourinary:     Vagina and uterus normal.     No vaginal discharge, erythema or tenderness.     No cervical motion tenderness or polyp.     IUD strings visualized.     Uterus is not enlarged or tender.     No right or left adnexal mass present.     Right adnexa not tender.     Left adnexa not tender.  Neck:     Musculoskeletal: Normal range of motion.     Thyroid: No thyromegaly.  Cardiovascular:     Rate and Rhythm: Normal rate and regular rhythm.     Heart sounds: Normal heart sounds. No murmur.  Pulmonary:     Effort: Pulmonary effort is normal.     Breath sounds: Normal breath sounds.  Chest:     Breasts:        Right: No mass, nipple discharge, skin change or tenderness.        Left: No mass, nipple discharge, skin change or tenderness.  Abdominal:     Palpations: Abdomen is soft.     Tenderness: There is no abdominal tenderness. There is no guarding.  Musculoskeletal: Normal range of motion.  Neurological:     Mental Status: She is alert and oriented to person, place, and time.     Cranial Nerves: No cranial nerve deficit.  Psychiatric:        Behavior: Behavior normal.  Vitals signs reviewed.      Assessment/Plan: No diagnosis found.      GYN counsel mammography screening, adequate intake of calcium and vitamin D, diet and exercise     F/U  No follow-ups on file.  Sutter Ahlgren B. Talin Rozeboom, PA-C 01/22/2019 7:35 PM

## 2019-01-23 ENCOUNTER — Ambulatory Visit: Payer: BC Managed Care – PPO | Admitting: Obstetrics and Gynecology

## 2019-02-06 ENCOUNTER — Other Ambulatory Visit: Payer: Self-pay

## 2019-02-06 ENCOUNTER — Encounter: Payer: Self-pay | Admitting: Obstetrics and Gynecology

## 2019-02-06 ENCOUNTER — Ambulatory Visit (INDEPENDENT_AMBULATORY_CARE_PROVIDER_SITE_OTHER): Payer: BC Managed Care – PPO | Admitting: Obstetrics and Gynecology

## 2019-02-06 VITALS — BP 112/82 | Ht 71.0 in | Wt 167.0 lb

## 2019-02-06 DIAGNOSIS — N951 Menopausal and female climacteric states: Secondary | ICD-10-CM

## 2019-02-06 DIAGNOSIS — Z01419 Encounter for gynecological examination (general) (routine) without abnormal findings: Secondary | ICD-10-CM | POA: Diagnosis not present

## 2019-02-06 DIAGNOSIS — Z1231 Encounter for screening mammogram for malignant neoplasm of breast: Secondary | ICD-10-CM

## 2019-02-06 DIAGNOSIS — Z1329 Encounter for screening for other suspected endocrine disorder: Secondary | ICD-10-CM

## 2019-02-06 DIAGNOSIS — Z30431 Encounter for routine checking of intrauterine contraceptive device: Secondary | ICD-10-CM

## 2019-02-06 NOTE — Patient Instructions (Addendum)
I value your feedback and entrusting us with your care. If you get a [facility] patient survey, I would appreciate you taking the time to let us know about your experience today. Thank you!  [facility]: [phone]  [facility]: [phone]  

## 2019-02-06 NOTE — Progress Notes (Signed)
PCP:  Patient, No Pcp Per   Chief Complaint  Patient presents with  . Gynecologic Exam    hot flashes, night sweats x 6 months     HPI:      [name] is a 43 y.o. G1P1001 who LMP was No LMP recorded. (Menstrual status: IUD)., presents today for her annual examination.  Her menses are absent due to IUD. Dysmenorrhea none. She does not have intermenstrual bleeding. Having daily hot flashes now and night sweats 2-3 times weekly for past 6 months. Trying black cohosh without sx relief. Had normal CMP and CBC 1/20. No recent thyroid checked. Mom with earlier menopause and vasomotor sx.   Sex activity: single partner, contraception - IUD. Mirena replaced 09/27/17.  Last Pap: 01/19/17  Results were: no abnormalities /neg HPV DNA  Hx of STDs: none  Last mammo: 2013 There is no FH of breast cancer. There is no FH of ovarian cancer. There is pancreatic cancer in her pat uncle. The patient does do self-breast exams.  Tobacco use: The patient denies current or previous tobacco use. Alcohol use: none No drug use.  Exercise: moderately active  She does get adequate calcium and Vitamin D in her diet.  Normal labs 1/20.  Past Medical History:  Diagnosis Date  . Fibroadenosis of breast    06/2010,08/2011;RT. Dr. Lemar LivingsByrnett  . Seizure disorder (HCC)   . Seizures (HCC)     Past Surgical History:  Procedure Laterality Date  . BREAST SURGERY Right 06/2010   breast biopsy; fibroadenoma  . CESAREAN SECTION    . INTRAUTERINE DEVICE (IUD) INSERTION  10/19/2012   mirena  . none      Family History  Problem Relation Age of Onset  . Diabetes Mother   . Hyperlipidemia Mother   . Hypertension Mother   . Pancreatic cancer Paternal Uncle [id number]    Social History   Socioeconomic History  . Marital status: Married    Spouse name: Connie Fuentes  . Number of children: 1  . Years of education: 2116  . Highest education level: Not on file  Occupational History    Comment: [employer]  Social Needs   . Financial resource strain: Not on file  . Food insecurity    Worry: Not on file    Inability: Not on file  . Transportation needs    Medical: Not on file    Non-medical: Not on file  Tobacco Use  . Smoking status: Never Smoker  . Smokeless tobacco: Never Used  Substance and Sexual Activity  . Alcohol use: Not Currently  . Drug use: No  . Sexual activity: Yes    Birth control/protection: I.U.D.    Comment: Mirena   Lifestyle  . Physical activity    Days per week: Not on file    Minutes per session: Not on file  . Stress: Not on file  Relationships  . Social Musicianconnections    Talks on phone: Not on file    Gets together: Not on file    Attends religious service: Not on file    Active member of club or organization: Not on file    Attends meetings of clubs or organizations: Not on file    Relationship status: Not on file  . Intimate partner violence    Fear of current or ex partner: Not on file    Emotionally abused: Not on file    Physically abused: Not on file    Forced sexual activity: Not on  file  Other Topics Concern  . Not on file  Social History Narrative   Patient is married Connie Fuentes) and lives at home with her husband and child.   Patient is working full-time.   Patient has a Buyer, retail' degree   Patient is right-handed.   Patient drinks two cups of coffee daily.    Current Meds  Medication Sig  . carbamazepine (TEGRETOL XR) 100 MG 12 hr tablet Take 1 tablet (100 mg total) by mouth 2 (two) times daily.  . carbamazepine (TEGRETOL XR) 400 MG 12 hr tablet Take 1 tablet (400 mg total) by mouth 2 (two) times daily.     ROS:  Review of Systems  Constitutional: Negative for fatigue, fever and unexpected weight change.  Respiratory: Negative for cough, shortness of breath and wheezing.   Cardiovascular: Negative for chest pain, palpitations and leg swelling.  Gastrointestinal: Negative for blood in stool, constipation, diarrhea, nausea and vomiting.  Endocrine:  Negative for cold intolerance, heat intolerance and polyuria.  Genitourinary: Negative for dyspareunia, dysuria, flank pain, frequency, genital sores, hematuria, menstrual problem, pelvic pain, urgency, vaginal bleeding, vaginal discharge and vaginal pain.  Musculoskeletal: Negative for back pain, joint swelling and myalgias.  Skin: Negative for rash.  Neurological: Negative for dizziness, syncope, light-headedness, numbness and headaches.  Hematological: Negative for adenopathy.  Psychiatric/Behavioral: Negative for agitation, confusion, sleep disturbance and suicidal ideas. The patient is not nervous/anxious.      Objective: BP 112/82   Ht 5\' 11"  (1.803 m)   Wt 167 lb (75.8 kg)   BMI 23.29 kg/m    Physical Exam Constitutional:      Appearance: She is well-developed.  Genitourinary:     Vulva, vagina, uterus, right adnexa and left adnexa normal.     No vulval lesion or tenderness noted.     No vaginal discharge, erythema or tenderness.     No cervical motion tenderness or polyp.     IUD strings visualized.     Uterus is not enlarged or tender.     No right or left adnexal mass present.     Right adnexa not tender.     Left adnexa not tender.  Neck:     Musculoskeletal: Normal range of motion.     Thyroid: No thyromegaly.  Cardiovascular:     Rate and Rhythm: Normal rate and regular rhythm.     Heart sounds: Normal heart sounds. No murmur.  Pulmonary:     Effort: Pulmonary effort is normal.     Breath sounds: Normal breath sounds.  Chest:     Breasts:        Right: No mass, nipple discharge, skin change or tenderness.        Left: No mass, nipple discharge, skin change or tenderness.  Abdominal:     Palpations: Abdomen is soft.     Tenderness: There is no abdominal tenderness. There is no guarding.  Musculoskeletal: Normal range of motion.  Neurological:     General: No focal deficit present.     Mental Status: She is alert and oriented to person, place, and time.      Cranial Nerves: No cranial nerve deficit.  Skin:    General: Skin is warm and dry.  Psychiatric:        Mood and Affect: Mood normal.        Behavior: Behavior normal.        Thought Content: Thought content normal.        Judgment: Judgment normal.  Vitals signs reviewed.      Assessment/Plan: Encounter for annual routine gynecological examination  Encounter for screening mammogram for malignant neoplasm of breast - Plan: MM 3D SCREEN BREAST BILATERAL; pt to sched mammo  Encounter for routine checking of intrauterine contraceptive device (IUD); IUD strings in place.  Perimenopausal vasomotor symptoms--Check thyroid. If neg, most likely perimenopausal due to mom with early menopause. Can try estroven.  Thyroid disorder screening - Plan: TSH + free T4      GYN counsel mammography screening, adequate intake of calcium and vitamin D, diet and exercise     F/U  Return in about 1 year (around 02/06/2020).  Georges Victorio B. Kinslea Frances, PA-C 02/06/2019 4:05 PM

## 2019-02-07 ENCOUNTER — Telehealth: Payer: Self-pay | Admitting: Obstetrics and Gynecology

## 2019-02-07 DIAGNOSIS — R899 Unspecified abnormal finding in specimens from other organs, systems and tissues: Secondary | ICD-10-CM

## 2019-02-07 DIAGNOSIS — Z1329 Encounter for screening for other suspected endocrine disorder: Secondary | ICD-10-CM

## 2019-02-07 LAB — TSH+FREE T4
Free T4: 0.78 ng/dL — ABNORMAL LOW (ref 0.82–1.77)
TSH: 2.65 u[IU]/mL (ref 0.450–4.500)

## 2019-02-07 NOTE — Telephone Encounter (Signed)
Pt aware of normal TSH, slightly abn free T4. Recheck in 4 wks. Vasomotor sx most likely hormonal.

## 2019-04-19 ENCOUNTER — Inpatient Hospital Stay: Admission: RE | Admit: 2019-04-19 | Payer: Self-pay | Source: Ambulatory Visit

## 2019-05-02 ENCOUNTER — Other Ambulatory Visit: Payer: Self-pay

## 2019-05-02 ENCOUNTER — Ambulatory Visit
Admission: RE | Admit: 2019-05-02 | Discharge: 2019-05-02 | Disposition: A | Discharge status: Home / Self Care | Payer: BC Managed Care – PPO | Source: Ambulatory Visit | Attending: Obstetrics and Gynecology | Admitting: Obstetrics and Gynecology

## 2019-05-02 ENCOUNTER — Encounter: Payer: Self-pay | Admitting: Obstetrics and Gynecology

## 2019-05-02 DIAGNOSIS — Z1231 Encounter for screening mammogram for malignant neoplasm of breast: Secondary | ICD-10-CM | POA: Insufficient documentation

## 2019-05-08 NOTE — Progress Notes (Signed)
PATIENT: Connie Fuentes DOB: 1975/11/25  REASON FOR VISIT: follow up HISTORY FROM: patient  HISTORY OF PRESENT ILLNESS: Today 05/09/19 Connie Fuentes is a 44 year old female with history of complex partial seizure disorder.  She remains on Tegretol XR 500 mg twice daily (400 +100 mg tablet).  Her last seizure occurred in 2019.  Since, she has not had recurrent seizure.  She is tolerating medication without side effect.  She reports before her seizure, she has an aura, feel the seizure coming on.  Her typical seizure is described as staring off, may last between 1 to 10 minutes.  She works at [employer] full time, she has a 50 year old daughter.  She has stopped drinking alcohol, because she had pre-seizure feeling afterwards. She is having hot flashes.  She presents today for evaluation unaccompanied.  HISTORY OF PRESENT ILLNESS:UPDATE 1/21/2020CM Connie Fuentes, 44 year old female returns for follow-up with history of complex partial seizure disorder.  She denies any seizures since last seen.  He remains on Tegretol extended release 500 mg twice daily.  She denies any falls any  balance issues.  She denies any daytime drowsiness.  She has had no interval medical issues.  She returns for reevaluation.  She continues to work full-time  REVIEW OF SYSTEMS: Out of a complete 14 system review of symptoms, the patient complains only of the following symptoms, and all other reviewed systems are negative.  Seizures  ALLERGIES: No Known Allergies  HOME MEDICATIONS: Outpatient Medications Prior to Visit  Medication Sig Dispense Refill  . Black Cohosh 160 MG CAPS Take by mouth.    . carbamazepine (TEGRETOL XR) 100 MG 12 hr tablet Take 1 tablet (100 mg total) by mouth 2 (two) times daily. 180 tablet 3  . carbamazepine (TEGRETOL XR) 400 MG 12 hr tablet Take 1 tablet (400 mg total) by mouth 2 (two) times daily. 180 tablet 3  . Multiple Vitamin (MULTIVITAMIN ADULT PO) Take by mouth.    . levonorgestrel  (MIRENA) 20 MCG/24HR IUD 1 Intra Uterine Device (1 each total) by Intrauterine route once for 1 dose. 1 each 0   No facility-administered medications prior to visit.    PAST MEDICAL HISTORY: Past Medical History:  Diagnosis Date  . Fibroadenosis of breast    06/2010,08/2011;RT. Dr. Lemar Livings  . Seizure disorder (HCC)   . Seizures (HCC)     PAST SURGICAL HISTORY: Past Surgical History:  Procedure Laterality Date  . BREAST SURGERY Right 06/2010   breast biopsy; fibroadenoma  . CESAREAN SECTION    . INTRAUTERINE DEVICE (IUD) INSERTION  10/19/2012   mirena  . none      FAMILY HISTORY: Family History  Problem Relation Age of Onset  . Diabetes Mother   . Hyperlipidemia Mother   . Hypertension Mother   . Pancreatic cancer Paternal Uncle 63  . Breast cancer Neg Hx     SOCIAL HISTORY: Social History   Socioeconomic History  . Marital status: Married    Spouse name: [name]  . Number of children: 1  . Years of education: 32  . Highest education level: Not on file  Occupational History    Comment: [employer]  Tobacco Use  . Smoking status: Never Smoker  . Smokeless tobacco: Never Used  Substance and Sexual Activity  . Alcohol use: Not Currently  . Drug use: No  . Sexual activity: Yes    Birth control/protection: I.U.D.    Comment: Mirena   Other Topics Concern  . Not  on file  Social History Narrative   Patient is married [name]) and lives at home with her husband and child.   Patient is working full-time.   Patient has a Buyer, retail' degree   Patient is right-handed.   Patient drinks two cups of coffee daily.   Social Determinants of Health   Financial Resource Strain:   . Difficulty of Paying Living Expenses: Not on file  Food Insecurity:   . Worried About Charity fundraiser in the Last Year: Not on file  . Ran Out of Food in the Last Year: Not on file  Transportation Needs:   . Lack of Transportation (Medical): Not on file  . Lack of Transportation  (Non-Medical): Not on file  Physical Activity:   . Days of Exercise per Week: Not on file  . Minutes of Exercise per Session: Not on file  Stress:   . Feeling of Stress : Not on file  Social Connections:   . Frequency of Communication with Friends and Family: Not on file  . Frequency of Social Gatherings with Friends and Family: Not on file  . Attends Religious Services: Not on file  . Active Member of Clubs or Organizations: Not on file  . Attends Archivist Meetings: Not on file  . Marital Status: Not on file  Intimate Partner Violence:   . Fear of Current or Ex-Partner: Not on file  . Emotionally Abused: Not on file  . Physically Abused: Not on file  . Sexually Abused: Not on file   PHYSICAL EXAM  Vitals:   05/09/19 0830  BP: 111/71  Pulse: 75  Temp: (!) 97.1 F (36.2 C)  Weight: 166 lb 12.8 oz (75.7 kg)  Height: 5\' 11"  (1.803 m)   Body mass index is 23.26 kg/m.  Generalized: Well developed, in no acute distress   Neurological examination  Mentation: Alert oriented to time, place, history taking. Follows all commands speech and language fluent Cranial nerve II-XII: Pupils were equal round reactive to light. Extraocular movements were full, visual field were full on confrontational test. Facial sensation and strength were normal.  Head turning and shoulder shrug  were normal and symmetric. Motor: The motor testing reveals 5 over 5 strength of all 4 extremities. Good symmetric motor tone is noted throughout.  Sensory: Sensory testing is intact to soft touch on all 4 extremities. No evidence of extinction is noted.  Coordination: Cerebellar testing reveals good finger-nose-finger and heel-to-shin bilaterally.  Gait and station: Gait is normal. Tandem gait is normal. Romberg is negative. No drift is seen.  Reflexes: Deep tendon reflexes are symmetric and normal bilaterally.   DIAGNOSTIC DATA (LABS, IMAGING, TESTING) - I reviewed patient records, labs, notes,  testing and imaging myself where available.  Lab Results  Component Value Date   WBC 5.2 05/03/2018   HGB 13.6 05/03/2018   HCT 39.5 05/03/2018   MCV 93 05/03/2018   PLT 279 05/03/2018      Component Value Date/Time   NA 140 05/03/2018 0913   K 4.4 05/03/2018 0913   CL 99 05/03/2018 0913   CO2 26 05/03/2018 0913   GLUCOSE 71 05/03/2018 0913   BUN 11 05/03/2018 0913   CREATININE 0.51 (L) 05/03/2018 0913   CALCIUM 9.5 05/03/2018 0913   PROT 6.8 05/03/2018 0913   ALBUMIN 4.5 05/03/2018 0913   AST 32 05/03/2018 0913   ALT 36 (H) 05/03/2018 0913   ALKPHOS 68 05/03/2018 0913   BILITOT 0.4 05/03/2018 0913   GFRNONAA  120 05/03/2018 0913   GFRAA 138 05/03/2018 0913   No results found for: CHOL, HDL, LDLCALC, LDLDIRECT, TRIG, CHOLHDL No results found for: LYYT0P No results found for: VITAMINB12 Lab Results  Component Value Date   TSH 2.650 02/06/2019   ASSESSMENT AND PLAN 44 y.o. year old female  has a past medical history of Fibroadenosis of breast, Seizure disorder (HCC), and Seizures (HCC). here with:  1.  Seizures  She has continued to do well, her last seizure occurred in 2019 (prior was in 2014, question whether drug manufacturer changed).  She will remain on Tegretol XR total 500 mg twice daily (400 + 100 mg tablet).  I will check routine lab work today.  She will call for recurrent seizure, otherwise follow-up in 1 year or sooner if needed. I refilled the 400 mg tablet, no refills needed on the 100 mg tablet.   I spent 15 minutes with the patient. 50% of this time was spent discussing her plan of care.  Margie Ege, AGNP-C, DNP 05/09/2019, 8:41 AM [facility] [address] [place], [place] [address] [phone]

## 2019-05-09 ENCOUNTER — Ambulatory Visit: Payer: BC Managed Care – PPO | Admitting: Neurology

## 2019-05-09 ENCOUNTER — Other Ambulatory Visit: Payer: Self-pay

## 2019-05-09 ENCOUNTER — Encounter: Payer: Self-pay | Admitting: Neurology

## 2019-05-09 VITALS — BP 111/71 | HR 75 | Temp 97.1°F | Ht 71.0 in | Wt 166.8 lb

## 2019-05-09 DIAGNOSIS — G40209 Localization-related (focal) (partial) symptomatic epilepsy and epileptic syndromes with complex partial seizures, not intractable, without status epilepticus: Secondary | ICD-10-CM

## 2019-05-09 MED ORDER — CARBAMAZEPINE ER 400 MG PO TB12: 400.0000 mg | ORAL_TABLET | Freq: Two times a day (BID) | ORAL | 3 refills | Status: DC

## 2019-05-09 NOTE — Patient Instructions (Signed)
It was great to meet you!  Continue current dose of medication, check lab work today   Call for seizure, otherwise, see you in 1 Year !

## 2019-05-10 LAB — CBC WITH DIFFERENTIAL/PLATELET
Basophils Absolute: 0 10*3/uL (ref 0.0–0.2)
Basos: 1 %
EOS (ABSOLUTE): 0.1 10*3/uL (ref 0.0–0.4)
Eos: 3 %
Hematocrit: 37.9 % (ref 34.0–46.6)
Hemoglobin: 12.8 g/dL (ref 11.1–15.9)
Immature Grans (Abs): 0 10*3/uL (ref 0.0–0.1)
Immature Granulocytes: 0 %
Lymphocytes Absolute: 1.9 10*3/uL (ref 0.7–3.1)
Lymphs: 46 %
MCH: 30.6 pg (ref 26.6–33.0)
MCHC: 33.8 g/dL (ref 31.5–35.7)
MCV: 91 fL (ref 79–97)
Monocytes Absolute: 0.4 10*3/uL (ref 0.1–0.9)
Monocytes: 10 %
Neutrophils Absolute: 1.7 10*3/uL (ref 1.4–7.0)
Neutrophils: 40 %
Platelets: 296 10*3/uL (ref 150–450)
RBC: 4.18 x10E6/uL (ref 3.77–5.28)
RDW: 12.4 % (ref 11.7–15.4)
WBC: 4.1 10*3/uL (ref 3.4–10.8)

## 2019-05-10 LAB — COMPREHENSIVE METABOLIC PANEL
ALT: 23 IU/L (ref 0–32)
AST: 21 IU/L (ref 0–40)
Albumin/Globulin Ratio: 2.6 — ABNORMAL HIGH (ref 1.2–2.2)
Albumin: 4.6 g/dL (ref 3.8–4.8)
Alkaline Phosphatase: 76 IU/L (ref 39–117)
BUN/Creatinine Ratio: 15 (ref 9–23)
BUN: 9 mg/dL (ref 6–24)
Bilirubin Total: 0.3 mg/dL (ref 0.0–1.2)
CO2: 28 mmol/L (ref 20–29)
Calcium: 9.6 mg/dL (ref 8.7–10.2)
Chloride: 104 mmol/L (ref 96–106)
Creatinine, Ser: 0.62 mg/dL (ref 0.57–1.00)
GFR calc Af Amer: 129 mL/min/{1.73_m2} (ref >59)
GFR calc non Af Amer: 112 mL/min/{1.73_m2} (ref >59)
Globulin, Total: 1.8 g/dL (ref 1.5–4.5)
Glucose: 61 mg/dL — ABNORMAL LOW (ref 65–99)
Potassium: 4.1 mmol/L (ref 3.5–5.2)
Sodium: 143 mmol/L (ref 134–144)
Total Protein: 6.4 g/dL (ref 6.0–8.5)

## 2019-05-10 LAB — CARBAMAZEPINE LEVEL, TOTAL: Carbamazepine (Tegretol), S: 10.6 ug/mL (ref 4.0–12.0)

## 2019-05-10 NOTE — Progress Notes (Signed)
I have read the note, and I agree with the clinical assessment and plan.  Connie Fuentes Connie Fuentes   

## 2019-05-15 ENCOUNTER — Telehealth: Payer: Self-pay

## 2019-05-15 NOTE — Telephone Encounter (Signed)
Called pt to go over recent lab results per NP Sarah slack. no answer. Left a vm.   Please relay.  "Please call the patient. CBC was normal, CMP showed glucose was low at 61, make sure she is eating, not feeling the effects of hypoglycemia, Tegretol was therapeutic. Overall, doing very well, no change in dosing, see in 1 year" -NP SS

## 2019-05-23 NOTE — Telephone Encounter (Signed)
Please see past telephone/mychart encounters.

## 2019-06-27 ENCOUNTER — Other Ambulatory Visit: Payer: Self-pay | Admitting: *Deleted

## 2019-06-27 MED ORDER — CARBAMAZEPINE ER 100 MG PO TB12: 100.0000 mg | ORAL_TABLET | Freq: Two times a day (BID) | ORAL | 1 refills | Status: DC

## 2019-07-07 ENCOUNTER — Ambulatory Visit: Payer: BC Managed Care – PPO | Attending: Internal Medicine

## 2019-07-07 ENCOUNTER — Ambulatory Visit: Payer: BC Managed Care – PPO

## 2019-07-07 DIAGNOSIS — Z23 Encounter for immunization: Secondary | ICD-10-CM

## 2019-07-07 NOTE — Progress Notes (Signed)
   Covid-19 Vaccination Clinic  Name:  CHRISTOPHER HINK    MRN: [id number] DOB: 05/01/1975  07/07/2019  Ms. Doerr was observed post Covid-19 immunization for 15 minutes without incident. She was provided with Vaccine Information Sheet and instruction to access the V-Safe system.   Ms. Jewell was instructed to call 911 with any severe reactions post vaccine: Marland Kitchen Difficulty breathing  . Swelling of face and throat  . A fast heartbeat  . A bad rash all over body  . Dizziness and weakness   Immunizations Administered    Name Date Dose VIS Date Route   Pfizer COVID-19 Vaccine 07/07/2019  1:34 PM 0.3 mL 03/24/2019 Intramuscular   Manufacturer: [employer], [employer]   Lot: [id number]   NDC: [address]-7

## 2019-07-10 ENCOUNTER — Ambulatory Visit (INDEPENDENT_AMBULATORY_CARE_PROVIDER_SITE_OTHER): Payer: BC Managed Care – PPO

## 2019-07-10 ENCOUNTER — Other Ambulatory Visit: Payer: Self-pay

## 2019-07-10 ENCOUNTER — Telehealth: Payer: Self-pay

## 2019-07-10 DIAGNOSIS — R1030 Lower abdominal pain, unspecified: Secondary | ICD-10-CM

## 2019-07-10 DIAGNOSIS — R300 Dysuria: Secondary | ICD-10-CM | POA: Diagnosis not present

## 2019-07-10 LAB — POCT URINALYSIS DIPSTICK
Appearance: NORMAL
Bilirubin, UA: NEGATIVE
Blood, UA: NEGATIVE
Glucose, UA: NEGATIVE
Ketones, UA: NEGATIVE
Leukocytes, UA: NEGATIVE
Nitrite, UA: NEGATIVE
Odor: NORMAL
Protein, UA: NEGATIVE
Spec Grav, UA: 1.01 (ref 1.010–1.025)
Urobilinogen, UA: 0.2 E.U./dL
pH, UA: 6.5 (ref 5.0–8.0)

## 2019-07-10 NOTE — Telephone Encounter (Signed)
Appt

## 2019-07-10 NOTE — Progress Notes (Addendum)
Patient reports having lower abdominal pain and dysuria for 2 days. Has been taking OTC Cystex for symptomatic relief. UA performed (neg). Culture sent.   07/13/19 Rx macrobid eRxd for E Coli on C&S.

## 2019-07-10 NOTE — Telephone Encounter (Signed)
Pt calling; knows she has an UTI; can rx be sent to [facility] in [place]?  [phone]

## 2019-07-10 NOTE — Telephone Encounter (Signed)
Thx for notifying pt and sending C&S.

## 2019-07-10 NOTE — Telephone Encounter (Signed)
Spoke w/patient to give negative UA results. Also, inquired about patients s&s. She reports having lower abdominal pain and dysuria since Saturday evening. Advised since symptomatic, will send for culture. She also reports that she has been taking OTC cystex to help with symptoms.

## 2019-07-10 NOTE — Telephone Encounter (Signed)
Called pt and she will be stopping by our office for a urine drop off.

## 2019-07-10 NOTE — Telephone Encounter (Signed)
No recent C&S. Can she come by for urine sample drop off?

## 2019-07-12 NOTE — Telephone Encounter (Signed)
Pt left msg on nurse line asking for urine culture results. Advised they're still not back. Mentioned she is still having painful urination and lower abd pain. Denies blood in urine/when wipes.

## 2019-07-13 LAB — URINE CULTURE

## 2019-07-13 MED ORDER — NITROFURANTOIN MONOHYD MACRO 100 MG PO CAPS: 100.0000 mg | ORAL_CAPSULE | Freq: Two times a day (BID) | ORAL | 0 refills | Status: AC

## 2019-07-13 NOTE — Addendum Note (Signed)
Addended by: Althea Grimmer B on: 07/13/2019 04:39 PM   Modules accepted: Orders

## 2019-07-13 NOTE — Telephone Encounter (Signed)
Pt aware of urine culture results

## 2019-07-13 NOTE — Progress Notes (Signed)
Rx macrobid eRxd. Pls notify pt C&S was positive for culture and abx sent. F/u prn.

## 2019-07-13 NOTE — Progress Notes (Signed)
Pt aware.

## 2019-07-18 ENCOUNTER — Telehealth: Payer: Self-pay

## 2019-07-18 NOTE — Telephone Encounter (Signed)
Pt calling triage still feels like the UTI is lingering,, should she have a different antibiotic sent in ? Pt reports she is going on a trip, and needs this token care of before she leaves please . she's aware you are out, but responding to messages

## 2019-07-19 MED ORDER — CIPROFLOXACIN HCL 500 MG PO TABS: 500.0000 mg | ORAL_TABLET | Freq: Two times a day (BID) | ORAL | 0 refills | Status: AC

## 2019-07-19 NOTE — Telephone Encounter (Signed)
Rx cipro eRx for UTI sx. F/u prn

## 2019-07-20 NOTE — Telephone Encounter (Signed)
Called pt, no answer, left msg Rx was sent to pharmacy.

## 2019-08-01 ENCOUNTER — Ambulatory Visit: Payer: BC Managed Care – PPO | Attending: Internal Medicine

## 2019-08-01 DIAGNOSIS — Z23 Encounter for immunization: Secondary | ICD-10-CM

## 2019-08-01 NOTE — Progress Notes (Signed)
   Covid-19 Vaccination Clinic  Name:  Connie Fuentes    MRN: [id number] DOB: April 01, 1976  08/01/2019  Connie Fuentes was observed post Covid-19 immunization for 15 minutes without incident. She was provided with Vaccine Information Sheet and instruction to access the V-Safe system.   Connie Fuentes was instructed to call 911 with any severe reactions post vaccine: Marland Kitchen Difficulty breathing  . Swelling of face and throat  . A fast heartbeat  . A bad rash all over body  . Dizziness and weakness   Immunizations Administered    Name Date Dose VIS Date Route   Pfizer COVID-19 Vaccine 08/01/2019  9:17 AM 0.3 mL 06/07/2018 Intramuscular   Manufacturer: [employer], [employer]   Lot: [id number]   NDC: [address]-7

## 2019-12-24 ENCOUNTER — Other Ambulatory Visit: Payer: Self-pay | Admitting: Neurology

## 2020-02-07 ENCOUNTER — Ambulatory Visit: Payer: BC Managed Care – PPO | Admitting: Obstetrics and Gynecology

## 2020-02-12 ENCOUNTER — Encounter: Payer: Self-pay | Admitting: Obstetrics and Gynecology

## 2020-02-12 ENCOUNTER — Ambulatory Visit (INDEPENDENT_AMBULATORY_CARE_PROVIDER_SITE_OTHER): Payer: BC Managed Care – PPO | Admitting: Obstetrics and Gynecology

## 2020-02-12 ENCOUNTER — Other Ambulatory Visit: Payer: Self-pay

## 2020-02-12 VITALS — BP 110/80 | Ht 71.0 in | Wt 167.0 lb

## 2020-02-12 DIAGNOSIS — Z1231 Encounter for screening mammogram for malignant neoplasm of breast: Secondary | ICD-10-CM | POA: Diagnosis not present

## 2020-02-12 DIAGNOSIS — N951 Menopausal and female climacteric states: Secondary | ICD-10-CM | POA: Diagnosis not present

## 2020-02-12 DIAGNOSIS — Z01419 Encounter for gynecological examination (general) (routine) without abnormal findings: Secondary | ICD-10-CM

## 2020-02-12 DIAGNOSIS — Z30431 Encounter for routine checking of intrauterine contraceptive device: Secondary | ICD-10-CM | POA: Diagnosis not present

## 2020-02-12 NOTE — Patient Instructions (Addendum)
I value your feedback and entrusting us with your care. If you get a [facility] patient survey, I would appreciate you taking the time to let us know about your experience today. Thank you! ° °As of March 23, 2019, your lab results will be released to your MyChart immediately, before I even have a chance to see them. Please give me time to review them and contact you if there are any abnormalities. Thank you for your patience.  ° °[facility]: [phone] ° ° ° °

## 2020-02-12 NOTE — Progress Notes (Signed)
PCP:  Patient, No Pcp Per   Chief Complaint  Patient presents with  . Gynecologic Exam     HPI:      [name] is a 44 y.o. G1P1001 who LMP was No LMP recorded. (Menstrual status: IUD)., presents today for her annual examination.  Her menses are absent due to IUD. Dysmenorrhea none. She does not have intermenstrual bleeding. Having daily hot flashes and night sweats, not improved from last yr. Has done black cohosh, and now estroven without sx relief. Had normal CMP and CBC 1/20, normal TSH 2021. Mom with earlier menopause and vasomotor sx.   Sex activity: single partner, contraception - IUD. Mirena replaced 09/27/17.  Last Pap: 01/19/17  Results were: no abnormalities /neg HPV DNA  Hx of STDs: none  Last mammo: 05/02/19 Results: normal, repeat in 12 months.  There is no FH of breast cancer. There is no FH of ovarian cancer. There is pancreatic cancer in her pat uncle, possible uterine vs cx in her mat aunt (pt to clarify). The patient does do self-breast exams.  Tobacco use: The patient denies current or previous tobacco use. Alcohol use: social No drug use.  Exercise: moderately active  She does get adequate calcium and Vitamin D in her diet.  Normal labs 1/20.  Past Medical History:  Diagnosis Date  . Fibroadenosis of breast    06/2010,08/2011;RT. Dr. Lemar Livings  . Seizure disorder (HCC)   . Seizures (HCC)     Past Surgical History:  Procedure Laterality Date  . BREAST SURGERY Right 06/2010   breast biopsy; fibroadenoma  . CESAREAN SECTION    . INTRAUTERINE DEVICE (IUD) INSERTION  10/19/2012   mirena  . none      Family History  Problem Relation Age of Onset  . Diabetes Mother   . Hyperlipidemia Mother   . Hypertension Mother   . Pancreatic cancer Paternal Uncle 45  . Breast cancer Neg Hx     Social History   Socioeconomic History  . Marital status: Married    Spouse name: [name]  . Number of children: 1  . Years of education: 70  . Highest  education level: Not on file  Occupational History    Comment: [employer]  Tobacco Use  . Smoking status: Never Smoker  . Smokeless tobacco: Never Used  Vaping Use  . Vaping Use: Never used  Substance and Sexual Activity  . Alcohol use: Not Currently  . Drug use: No  . Sexual activity: Yes    Birth control/protection: I.U.D.    Comment: Mirena   Other Topics Concern  . Not on file  Social History Narrative   Patient is married [name]) and lives at home with her husband and child.   Patient is working full-time.   Patient has a Chief Operating Officer' degree   Patient is right-handed.   Patient drinks two cups of coffee daily.   Social Determinants of Health   Financial Resource Strain:   . Difficulty of Paying Living Expenses: Not on file  Food Insecurity:   . Worried About Programme researcher, broadcasting/film/video in the Last Year: Not on file  . Ran Out of Food in the Last Year: Not on file  Transportation Needs:   . Lack of Transportation (Medical): Not on file  . Lack of Transportation (Non-Medical): Not on file  Physical Activity:   . Days of Exercise per Week: Not on file  . Minutes of Exercise per Session: Not on file  Stress:   .  Feeling of Stress : Not on file  Social Connections:   . Frequency of Communication with Friends and Family: Not on file  . Frequency of Social Gatherings with Friends and Family: Not on file  . Attends Religious Services: Not on file  . Active Member of Clubs or Organizations: Not on file  . Attends Banker Meetings: Not on file  . Marital Status: Not on file  Intimate Partner Violence:   . Fear of Current or Ex-Partner: Not on file  . Emotionally Abused: Not on file  . Physically Abused: Not on file  . Sexually Abused: Not on file    Current Meds  Medication Sig  . Black Cohosh 160 MG CAPS Take by mouth.  . carbamazepine (TEGRETOL XR) 400 MG 12 hr tablet Take 1 tablet (400 mg total) by mouth 2 (two) times daily.  . Multiple Vitamin (MULTIVITAMIN  ADULT PO) Take by mouth.  . TEGRETOL-XR 100 MG 12 hr tablet TAKE 1 TABLET TWICE A DAY     ROS:  Review of Systems  Constitutional: Negative for fatigue, fever and unexpected weight change.  Respiratory: Negative for cough, shortness of breath and wheezing.   Cardiovascular: Negative for chest pain, palpitations and leg swelling.  Gastrointestinal: Negative for blood in stool, constipation, diarrhea, nausea and vomiting.  Endocrine: Negative for cold intolerance, heat intolerance and polyuria.  Genitourinary: Negative for dyspareunia, dysuria, flank pain, frequency, genital sores, hematuria, menstrual problem, pelvic pain, urgency, vaginal bleeding, vaginal discharge and vaginal pain.  Musculoskeletal: Negative for back pain, joint swelling and myalgias.  Skin: Negative for rash.  Neurological: Negative for dizziness, syncope, light-headedness, numbness and headaches.  Hematological: Negative for adenopathy.  Psychiatric/Behavioral: Negative for agitation, confusion, sleep disturbance and suicidal ideas. The patient is not nervous/anxious.      Objective: BP 110/80   Ht 5\' 11"  (1.803 m)   Wt 167 lb (75.8 kg)   BMI 23.29 kg/m    Physical Exam Constitutional:      Appearance: She is well-developed.  Genitourinary:     Vulva, vagina, uterus, right adnexa and left adnexa normal.     No vulval lesion or tenderness noted.     No vaginal discharge, erythema or tenderness.     No cervical motion tenderness or polyp.     IUD strings visualized.     Uterus is not enlarged or tender.     No right or left adnexal mass present.     Right adnexa not tender.     Left adnexa not tender.  Neck:     Thyroid: No thyromegaly.  Cardiovascular:     Rate and Rhythm: Normal rate and regular rhythm.     Heart sounds: Normal heart sounds. No murmur heard.   Pulmonary:     Effort: Pulmonary effort is normal.     Breath sounds: Normal breath sounds.  Chest:     Breasts:        Right: No  mass, nipple discharge, skin change or tenderness.        Left: No mass, nipple discharge, skin change or tenderness.  Abdominal:     Palpations: Abdomen is soft.     Tenderness: There is no abdominal tenderness. There is no guarding.  Musculoskeletal:        General: Normal range of motion.     Cervical back: Normal range of motion.  Neurological:     General: No focal deficit present.     Mental Status: She is alert  and oriented to person, place, and time.     Cranial Nerves: No cranial nerve deficit.  Skin:    General: Skin is warm and dry.  Psychiatric:        Mood and Affect: Mood normal.        Behavior: Behavior normal.        Thought Content: Thought content normal.        Judgment: Judgment normal.  Vitals reviewed.      Assessment/Plan: Encounter for annual routine gynecological examination  Encounter for routine checking of intrauterine contraceptive device (IUD); IUD in place, due for rem 09/2024  Encounter for screening mammogram for malignant neoplasm of breast - Plan: MM 3D SCREEN BREAST BILATERAL; pt to sched mammo  Perimenopausal vasomotor symptoms--cont estroven, discussed cooling techniques. F/u prn       GYN counsel mammography screening, adequate intake of calcium and vitamin D, diet and exercise     F/U  Return in about 1 year (around 02/11/2021).  Keylen Eckenrode B. Truitt Cruey, PA-C 02/12/2020 8:44 AM

## 2020-04-23 ENCOUNTER — Other Ambulatory Visit: Payer: BC Managed Care – PPO

## 2020-04-26 ENCOUNTER — Other Ambulatory Visit: Payer: Self-pay | Admitting: Obstetrics and Gynecology

## 2020-04-26 DIAGNOSIS — Z1231 Encounter for screening mammogram for malignant neoplasm of breast: Secondary | ICD-10-CM

## 2020-05-09 ENCOUNTER — Ambulatory Visit: Payer: BC Managed Care – PPO | Admitting: Neurology

## 2020-05-14 DIAGNOSIS — Z1231 Encounter for screening mammogram for malignant neoplasm of breast: Secondary | ICD-10-CM

## 2020-05-25 ENCOUNTER — Emergency Department: Payer: BC Managed Care – PPO

## 2020-05-25 ENCOUNTER — Other Ambulatory Visit: Payer: Self-pay

## 2020-05-25 ENCOUNTER — Emergency Department
Admission: EM | Admit: 2020-05-25 | Discharge: 2020-05-25 | Disposition: A | Discharge status: Home / Self Care | Payer: BC Managed Care – PPO | Attending: Emergency Medicine | Admitting: Emergency Medicine

## 2020-05-25 DIAGNOSIS — R5383 Other fatigue: Secondary | ICD-10-CM | POA: Diagnosis not present

## 2020-05-25 DIAGNOSIS — Z20822 Contact with and (suspected) exposure to covid-19: Secondary | ICD-10-CM | POA: Diagnosis not present

## 2020-05-25 DIAGNOSIS — M791 Myalgia, unspecified site: Secondary | ICD-10-CM

## 2020-05-25 DIAGNOSIS — R7989 Other specified abnormal findings of blood chemistry: Secondary | ICD-10-CM

## 2020-05-25 DIAGNOSIS — R55 Syncope and collapse: Secondary | ICD-10-CM

## 2020-05-25 DIAGNOSIS — E876 Hypokalemia: Secondary | ICD-10-CM

## 2020-05-25 DIAGNOSIS — R5381 Other malaise: Secondary | ICD-10-CM | POA: Insufficient documentation

## 2020-05-25 LAB — CBC WITH DIFFERENTIAL/PLATELET
Abs Immature Granulocytes: 0.02 10*3/uL (ref 0.00–0.07)
Basophils Absolute: 0 10*3/uL (ref 0.0–0.1)
Basophils Relative: 1 %
Eosinophils Absolute: 0 10*3/uL (ref 0.0–0.5)
Eosinophils Relative: 0 %
HCT: 38.6 % (ref 36.0–46.0)
Hemoglobin: 12.9 g/dL (ref 12.0–15.0)
Immature Granulocytes: 0 %
Lymphocytes Relative: 19 %
Lymphs Abs: 1.5 10*3/uL (ref 0.7–4.0)
MCH: 30.6 pg (ref 26.0–34.0)
MCHC: 33.4 g/dL (ref 30.0–36.0)
MCV: 91.5 fL (ref 80.0–100.0)
Monocytes Absolute: 0.5 10*3/uL (ref 0.1–1.0)
Monocytes Relative: 7 %
Neutro Abs: 5.7 10*3/uL (ref 1.7–7.7)
Neutrophils Relative %: 73 %
Platelets: 222 10*3/uL (ref 150–400)
RBC: 4.22 MIL/uL (ref 3.87–5.11)
RDW: 11.9 % (ref 11.5–15.5)
WBC: 7.7 10*3/uL (ref 4.0–10.5)
nRBC: 0 % (ref 0.0–0.2)

## 2020-05-25 LAB — URINE DRUG SCREEN, QUALITATIVE (ARMC ONLY)
Amphetamines, Ur Screen: NOT DETECTED
Barbiturates, Ur Screen: NOT DETECTED
Benzodiazepine, Ur Scrn: NOT DETECTED
Cannabinoid 50 Ng, Ur ~~LOC~~: POSITIVE — AB
Cocaine Metabolite,Ur ~~LOC~~: NOT DETECTED
MDMA (Ecstasy)Ur Screen: NOT DETECTED
Methadone Scn, Ur: NOT DETECTED
Opiate, Ur Screen: NOT DETECTED
Phencyclidine (PCP) Ur S: NOT DETECTED
Tricyclic, Ur Screen: NOT DETECTED

## 2020-05-25 LAB — CARBAMAZEPINE LEVEL, TOTAL: Carbamazepine Lvl: 9 ug/mL (ref 4.0–12.0)

## 2020-05-25 LAB — URINALYSIS, COMPLETE (UACMP) WITH MICROSCOPIC
Bilirubin Urine: NEGATIVE
Glucose, UA: NEGATIVE mg/dL
Hgb urine dipstick: NEGATIVE
Ketones, ur: NEGATIVE mg/dL
Leukocytes,Ua: NEGATIVE
Nitrite: NEGATIVE
Protein, ur: NEGATIVE mg/dL
Specific Gravity, Urine: 1.021 (ref 1.005–1.030)
pH: 5 (ref 5.0–8.0)

## 2020-05-25 LAB — COMPREHENSIVE METABOLIC PANEL
ALT: 23 U/L (ref 0–44)
AST: 24 U/L (ref 15–41)
Albumin: 3.8 g/dL (ref 3.5–5.0)
Alkaline Phosphatase: 63 U/L (ref 38–126)
Anion gap: 9 (ref 5–15)
BUN: 11 mg/dL (ref 6–20)
CO2: 23 mmol/L (ref 22–32)
Calcium: 8.6 mg/dL — ABNORMAL LOW (ref 8.9–10.3)
Chloride: 106 mmol/L (ref 98–111)
Creatinine, Ser: 0.52 mg/dL (ref 0.44–1.00)
GFR, Estimated: >60 mL/min (ref >60)
Glucose, Bld: 144 mg/dL — ABNORMAL HIGH (ref 70–99)
Potassium: 3 mmol/L — ABNORMAL LOW (ref 3.5–5.1)
Sodium: 138 mmol/L (ref 135–145)
Total Bilirubin: 0.5 mg/dL (ref 0.3–1.2)
Total Protein: 6.2 g/dL — ABNORMAL LOW (ref 6.5–8.1)

## 2020-05-25 LAB — RESP PANEL BY RT-PCR (FLU A&B, COVID) ARPGX2
Influenza A by PCR: NEGATIVE
Influenza B by PCR: NEGATIVE
SARS Coronavirus 2 by RT PCR: NEGATIVE

## 2020-05-25 LAB — TROPONIN I (HIGH SENSITIVITY)
Troponin I (High Sensitivity): 3 ng/L (ref <18)
Troponin I (High Sensitivity): <2 ng/L (ref <18)

## 2020-05-25 LAB — LACTIC ACID, PLASMA
Lactic Acid, Venous: 1.3 mmol/L (ref 0.5–1.9)
Lactic Acid, Venous: 1.4 mmol/L (ref 0.5–1.9)

## 2020-05-25 LAB — PROCALCITONIN: Procalcitonin: <0.1 ng/mL

## 2020-05-25 LAB — FIBRIN DERIVATIVES D-DIMER (ARMC ONLY): Fibrin derivatives D-dimer (ARMC): 986.9 ng/mL (FEU) — ABNORMAL HIGH (ref 0.00–499.00)

## 2020-05-25 LAB — POC URINE PREG, ED: Preg Test, Ur: NEGATIVE

## 2020-05-25 MED ORDER — LACTATED RINGERS IV BOLUS
1000.0000 mL | Freq: Once | INTRAVENOUS | Status: AC
  Administered 2020-05-25: 1000 mL via INTRAVENOUS

## 2020-05-25 MED ORDER — POTASSIUM CHLORIDE CRYS ER 20 MEQ PO TBCR
80.0000 meq | EXTENDED_RELEASE_TABLET | Freq: Once | ORAL | Status: AC
  Administered 2020-05-25: 80 meq via ORAL
  Filled 2020-05-25: qty 4

## 2020-05-25 MED ORDER — IOHEXOL 350 MG/ML SOLN
75.0000 mL | Freq: Once | INTRAVENOUS | Status: AC | PRN
  Administered 2020-05-25: 75 mL via INTRAVENOUS

## 2020-05-25 NOTE — Discharge Instructions (Signed)
IMPRESSION: 1. No central or segmental pulmonary embolus. Limited evaluation more distally due to timing of contrast. 2. No acute intrathoracic abnormality. 3. Incompletely evaluated 3.6 cm right hepatic lobe lesion. Recommend MRI liver protocol for further evaluation.

## 2020-05-25 NOTE — ED Triage Notes (Signed)
Reports generalized body aches and fatigue today. Syncopal episode tonight while on toilet. Hx of seizures. Denies hitting head. Pt alert and oriented on arrival and in NAD.

## 2020-05-25 NOTE — ED Notes (Signed)
COVID swab collected and in lab

## 2020-05-25 NOTE — ED Notes (Signed)
MD notified pt Orthostatic vitals complete.

## 2020-05-25 NOTE — ED Notes (Signed)
Patient transported to CT with technician on stretcher.  

## 2020-05-25 NOTE — ED Notes (Signed)
MD notified pt orthostatic vital signs complete. Pt visually symptomatic with reported and noted dizziness. Breathing also noted to be more labored with position change.

## 2020-05-25 NOTE — ED Provider Notes (Signed)
[facility] Emergency Department Provider Note  ____________________________________________   Event Date/Time   First MD Initiated Contact with Patient 05/25/20 0010     (approximate)  I have reviewed the triage vital signs and the nursing notes.   HISTORY  Chief Complaint Loss of Consciousness   HPI Connie Fuentes is a 45 y.o. female with a past medical history of seizure disorder on Tegretol who presents for assessment of fatigue, myalgias, and a syncopal episode that occurred this evening.  Patient states she been feeling poorly all day feeling achy and very tired when she went to go to the bathroom she passed out.  This apparently was witnessed by family he said she did not have any seizure-like activity although did fall off the toilet.  She reportedly did not hit her head on the patient is not sure if she hit anywhere else.  She denies any current focal pain including headache, earache, sore throat, chest pain abdominal pain, back pain or extremity pain.  She denies any recent cough, shortness of breath, nausea, vomiting, diarrhea, dysuria or rash or any other sick symptoms.  She has had a CBD gummy 2 days ago and has been compliant with her Tegretol.  She denies regular EtOH use or other illicit drug use.         Past Medical History:  Diagnosis Date  . Fibroadenosis of breast    06/2010,08/2011;RT. Dr. Lemar Livings  . Seizure disorder (HCC)   . Seizures [facility])     Patient Active Problem List   Diagnosis Date Noted  . Insomnia 05/18/2017  . Partial epilepsy with impairment of consciousness (HCC) 10/31/2013  . Encounter for therapeutic drug monitoring 10/31/2013    Past Surgical History:  Procedure Laterality Date  . BREAST SURGERY Right 06/2010   breast biopsy; fibroadenoma  . CESAREAN SECTION    . INTRAUTERINE DEVICE (IUD) INSERTION  10/19/2012   mirena  . none      Prior to Admission medications   Medication Sig Start Date End Date Taking?  Authorizing Provider  Black Cohosh 160 MG CAPS Take by mouth.    [provider]  carbamazepine (TEGRETOL XR) 400 MG 12 hr tablet Take 1 tablet (400 mg total) by mouth 2 (two) times daily. 05/09/19   Glean Salvo, NP  levonorgestrel (MIRENA) 20 MCG/24HR IUD 1 Intra Uterine Device (1 each total) by Intrauterine route once for 1 dose. 09/27/17 09/27/17  Copland, Ilona Sorrel, PA-C  Multiple Vitamin (MULTIVITAMIN ADULT PO) Take by mouth.    [provider]  TEGRETOL-XR 100 MG 12 hr tablet TAKE 1 TABLET TWICE A DAY 12/24/19   Levert Feinstein, MD    Allergies Patient has no known allergies.  Family History  Problem Relation Age of Onset  . Diabetes Mother   . Hyperlipidemia Mother   . Hypertension Mother   . Pancreatic cancer Paternal Uncle 72  . Breast cancer Neg Hx     Social History Social History   Tobacco Use  . Smoking status: Never Smoker  . Smokeless tobacco: Never Used  Vaping Use  . Vaping Use: Never used  Substance Use Topics  . Alcohol use: Not Currently  . Drug use: No    Review of Systems  Review of Systems  Constitutional: Positive for malaise/fatigue. Negative for chills and fever.  HENT: Negative for sore throat.   Eyes: Negative for pain.  Respiratory: Negative for cough and stridor.   Cardiovascular: Negative for chest pain.  Gastrointestinal:  Negative for vomiting.  Genitourinary: Negative for dysuria.  Musculoskeletal: Positive for myalgias.  Skin: Negative for rash.  Neurological: Positive for dizziness and loss of consciousness. Negative for seizures and headaches.  Psychiatric/Behavioral: Negative for suicidal ideas.  All other systems reviewed and are negative.     ____________________________________________   PHYSICAL EXAM:  VITAL SIGNS: ED Triage Vitals [05/25/20 0007]  Enc Vitals Group     BP      Pulse      Resp      Temp      Temp src      SpO2      Weight 165 lb (74.8 kg)     Height 5\' 11"  (1.803 m)     Head  Circumference      Peak Flow      Pain Score 5     Pain Loc      Pain Edu?      Excl. in GC?    Vitals:   05/25/20 0549 05/25/20 0550  BP: 118/75 112/84  Pulse: 77 93  Resp: 14 17  Temp:    SpO2: 99% 99%   Physical Exam Vitals and nursing note reviewed.  Constitutional:      General: She is not in acute distress.    Appearance: She is well-developed and well-nourished.  HENT:     Head: Normocephalic and atraumatic.     Right Ear: External ear normal.     Left Ear: External ear normal.     Nose: Nose normal.  Eyes:     Conjunctiva/sclera: Conjunctivae normal.  Cardiovascular:     Rate and Rhythm: Normal rate and regular rhythm.     Heart sounds: No murmur heard.   Pulmonary:     Effort: Pulmonary effort is normal. No respiratory distress.     Breath sounds: Normal breath sounds.  Abdominal:     Palpations: Abdomen is soft.     Tenderness: There is no abdominal tenderness.  Musculoskeletal:        General: No edema.     Cervical back: Neck supple.     Right lower leg: No edema.     Left lower leg: No edema.  Skin:    General: Skin is warm and dry.     Capillary Refill: Capillary refill takes less than 2 seconds.  Neurological:     Mental Status: She is alert and oriented to person, place, and time.  Psychiatric:        Mood and Affect: Mood and affect and mood normal.     PERRLA.  EOMI.  Patient has symmetric strength in her bilateral upper and lower extremities.  Sensation is intact to light touch throughout extremities.  2+ bilateral radial pulses.  Patient is very sleepy on arrival. ____________________________________________   LABS (all labs ordered are listed, but only abnormal results are displayed)  Labs Reviewed  COMPREHENSIVE METABOLIC PANEL - Abnormal; Notable for the following components:      Result Value   Potassium 3.0 (*)    Glucose, Bld 144 (*)    Calcium 8.6 (*)    Total Protein 6.2 (*)    All other components within normal limits   URINALYSIS, COMPLETE (UACMP) WITH MICROSCOPIC - Abnormal; Notable for the following components:   Color, Urine AMBER (*)    APPearance HAZY (*)    Bacteria, UA RARE (*)    All other components within normal limits  URINE DRUG SCREEN, QUALITATIVE ([facility] ONLY) - Abnormal; Notable for the  following components:   Cannabinoid 50 Ng, Ur Lamar POSITIVE (*)    All other components within normal limits  FIBRIN DERIVATIVES D-DIMER ([facility] ONLY) - Abnormal; Notable for the following components:   Fibrin derivatives D-dimer ([facility]) 986.90 (*)    All other components within normal limits  RESP PANEL BY RT-PCR (FLU A&B, COVID) ARPGX2  CBC WITH DIFFERENTIAL/PLATELET  LACTIC ACID, PLASMA  PROCALCITONIN  CARBAMAZEPINE LEVEL, TOTAL  LACTIC ACID, PLASMA  POC URINE PREG, ED  TROPONIN I (HIGH SENSITIVITY)  TROPONIN I (HIGH SENSITIVITY)   ____________________________________________  EKG  Sinus rhythm with a ventricular rate of 63, nonspecific T wave changes in lead I and lead II without any other clear evidence of acute ischemia or other significant arrhythmia.  ____________________________________________  RADIOLOGY  ED MD interpretation: CT head shows no evidence of acute intracranial abnormality.  Chest x-ray is unremarkable.  CTA chest with no evidence of PE, thorax, effusion edema or any other clear acute intrathoracic process.  Official radiology report(s): DG Chest 1 View  Result Date: 05/25/2020 CLINICAL DATA:  Hypotension.  Generalized body ache and fatigue. EXAM: CHEST  1 VIEW COMPARISON:  None. FINDINGS: The heart size and mediastinal contours are within normal limits. Both lungs are clear. The visualized skeletal structures are unremarkable. IMPRESSION: No active disease. Electronically Signed   By: Paulina Fusi M.D.   On: 05/25/2020 00:53   CT Head Wo Contrast  Result Date: 05/25/2020 CLINICAL DATA:  Mental status changes of unknown cause. Body ache and weakness. Syncopal episode. EXAM: CT  HEAD WITHOUT CONTRAST TECHNIQUE: Contiguous axial images were obtained from the base of the skull through the vertex without intravenous contrast. COMPARISON:  None. FINDINGS: Brain: The brain shows a normal appearance without evidence of malformation, atrophy, old or acute small or large vessel infarction, mass lesion, hemorrhage, hydrocephalus or extra-axial collection. Vascular: No hyperdense vessel. No evidence of atherosclerotic calcification. Skull: Normal.  No traumatic finding.  No focal bone lesion. Sinuses/Orbits: Sinuses are clear. Orbits appear normal. Mastoids are clear. Other: None significant IMPRESSION: Normal head CT. Electronically Signed   By: Paulina Fusi M.D.   On: 05/25/2020 00:42   CT Angio Chest PE W and/or Wo Contrast  Result Date: 05/25/2020 CLINICAL DATA:  Pulmonary embolus suspected. Generalized body, fatigue, hypotension EXAM: CT ANGIOGRAPHY CHEST WITH CONTRAST TECHNIQUE: Multidetector CT imaging of the chest was performed using the standard protocol during bolus administration of intravenous contrast. Multiplanar CT image reconstructions and MIPs were obtained to evaluate the vascular anatomy. CONTRAST:  43mL OMNIPAQUE IOHEXOL 350 MG/ML SOLN COMPARISON:  Ultrasound abdomen report without images [id number] FINDINGS: Cardiovascular: Fairly satisfactory opacification of the pulmonary arteries to the segmental level. Limited evaluation due to timing of intravenous contrast. No evidence of pulmonary embolism. Normal heart size. No significant pericardial effusion. The thoracic aorta is normal in caliber. No atherosclerotic plaque of the thoracic aorta. No coronary artery calcifications. Mediastinum/Nodes: No enlarged mediastinal, hilar, or axillary lymph nodes. Thyroid gland, trachea, and esophagus demonstrate no significant findings. Lungs/Pleura: Bilateral lower lobe subsegmental atelectasis. No pulmonary nodule. No pulmonary mass. No focal consolidation. No pleural effusion. No  pneumothorax Upper Abdomen: Incompletely evaluated 3.6 cm hypodensity within the right hepatic lobe. Vague hypodensity within the posterior peripheral hepatic lobe likely due to artifact. High density material within the gastric lumen likely related to ingested material/medication. Musculoskeletal: No chest wall abnormality. No suspicious lytic or blastic osseous lesions. No acute displaced fracture. Review of the MIP images confirms the above findings. IMPRESSION: 1.  No central or segmental pulmonary embolus. Limited evaluation more distally due to timing of contrast. 2. No acute intrathoracic abnormality. 3. Incompletely evaluated 3.6 cm right hepatic lobe lesion. Recommend MRI liver protocol for further evaluation. Electronically Signed   By: Tish Frederickson M.D.   On: 05/25/2020 06:41    ____________________________________________   PROCEDURES  Procedure(s) performed (including Critical Care):  .1-3 Lead EKG Interpretation Performed by: Gilles Chiquito, MD Authorized by: Gilles Chiquito, MD     Interpretation: normal     ECG rate assessment: normal     Rhythm: sinus rhythm     Ectopy: none     Conduction: normal       ____________________________________________   INITIAL IMPRESSION / ASSESSMENT AND PLAN / ED COURSE      Patient presents with above-stated history exam for assessment of body aches and fatigue with a syncopal episode that occurred earlier today.  On arrival she is afebrile hemodynamically stable.  She is oriented denies any acute pain on arrival.  Primary differential includes but is not limited to acute infectious process, metabolic derangements, possible postictal state from unwitnessed seizure, arrhythmia, PE, ACS, symptomatic anemia, orthostasis and vasovagal syncope.  Patient is not orthostatic.  D-dimer is elevated and CTA obtained to assess for PE shows no evidence of PE or any other clear acute intrathoracic process including evidence of heart  failure, pneumothorax, pneumonia, effusion or any other clear acute intrathoracic process.  ECG shows no arrhythmia and given nonelevated troponin x2 low suspicion for ACS.  CMP remarkable for K of 3 with no other significant lecture metabolic derangements.   CBC is unremarkable no leukocytosis or acute anemia.  Lactic acid is not elevated.  Covid is negative.  Pro-Cal is undetectable and overall very low suspicion for acute infectious process.  UA shows no blood or evidence of infection UDS negative.  Tegretol level is within normal limits.  Urine pregnancy test is negative.  Patient was aggressively hydrated and given potassium.  On reassessment she stated she felt much better.  Overall it is much lower suspicion for postictal state given this episode of syncope was witnessed and no seizure-like activity noted.  In addition patient states she feels much better after IV fluids and potassium.  She was observed to ambulate with steady gait and given otherwise stable vital signs reassuring exam and work-up with improvement in symptoms I believe she is safe for discharge with plan for outpatient follow-up.  Advised of incidental finding on CT of right hepatic liver lobe lesion and recommendation for outpatient routine MR follow-up.       ____________________________________________   FINAL CLINICAL IMPRESSION(S) / ED DIAGNOSES  Final diagnoses:  Syncope, unspecified syncope type  Other fatigue  Myalgia  Hypokalemia  Positive D dimer    Medications  lactated ringers bolus 1,000 mL (0 mLs Intravenous Stopped 05/25/20 0552)  lactated ringers bolus 1,000 mL (0 mLs Intravenous Stopped 05/25/20 0552)  potassium chloride SA (KLOR-CON) CR tablet 80 mEq (80 mEq Oral Given 05/25/20 0204)  iohexol (OMNIPAQUE) 350 MG/ML injection 75 mL (75 mLs Intravenous Contrast Given 05/25/20 4081)     ED Discharge Orders    None       Note:  This document was prepared using Dragon voice recognition software  and may include unintentional dictation errors.   Gilles Chiquito, MD 05/25/20 [phone]

## 2020-05-27 ENCOUNTER — Other Ambulatory Visit: Payer: Self-pay | Admitting: Obstetrics and Gynecology

## 2020-05-27 DIAGNOSIS — Z1231 Encounter for screening mammogram for malignant neoplasm of breast: Secondary | ICD-10-CM

## 2020-06-19 ENCOUNTER — Other Ambulatory Visit: Payer: Self-pay | Admitting: Neurology

## 2020-06-28 ENCOUNTER — Other Ambulatory Visit: Payer: Self-pay

## 2020-06-28 ENCOUNTER — Ambulatory Visit
Admission: RE | Admit: 2020-06-28 | Discharge: 2020-06-28 | Disposition: A | Discharge status: Home / Self Care | Payer: BC Managed Care – PPO | Source: Ambulatory Visit

## 2020-06-28 DIAGNOSIS — Z1231 Encounter for screening mammogram for malignant neoplasm of breast: Secondary | ICD-10-CM

## 2020-07-08 ENCOUNTER — Other Ambulatory Visit: Payer: Self-pay

## 2020-07-08 ENCOUNTER — Encounter: Payer: Self-pay | Admitting: Neurology

## 2020-07-08 ENCOUNTER — Ambulatory Visit: Payer: BC Managed Care – PPO | Admitting: Neurology

## 2020-07-08 VITALS — BP 124/70 | HR 77 | Ht 71.0 in | Wt 168.0 lb

## 2020-07-08 DIAGNOSIS — G40209 Localization-related (focal) (partial) symptomatic epilepsy and epileptic syndromes with complex partial seizures, not intractable, without status epilepticus: Secondary | ICD-10-CM

## 2020-07-08 MED ORDER — CARBAMAZEPINE ER 100 MG PO TB12: 100.0000 mg | ORAL_TABLET | Freq: Two times a day (BID) | ORAL | 4 refills | Status: DC

## 2020-07-08 MED ORDER — CARBAMAZEPINE ER 400 MG PO TB12: 400.0000 mg | ORAL_TABLET | Freq: Two times a day (BID) | ORAL | 4 refills | Status: DC

## 2020-07-08 NOTE — Progress Notes (Signed)
I have read the note, and I agree with the clinical assessment and plan.  Daemon Dowty K Marijo Quizon   

## 2020-07-08 NOTE — Progress Notes (Signed)
PATIENT: Connie Fuentes DOB: Aug 27, 1975  REASON FOR VISIT: follow up HISTORY FROM: patient  HISTORY OF PRESENT ILLNESS: Today 07/08/20 Connie Fuentes is a 45 year old female with history of complex partial seizure disorder she remains on Tegretol twice daily.  Her last seizure was in 2019. Before her seizures, she has an aura.  Typical seizures are described as staring off.  No longer drinks alcohol, due to a preseizure feeling afterwards. She had a syncopal episode back in February, related to dehydration, diarrhea, hypokalemia.  She quickly improved with hydration.  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  Presents today for evaluation unaccompanied.  Update 05/09/2019 SS:Ms. Biby is a 45 year old female with history of complex partial seizure disorder.  She remains on Tegretol XR 500 mg twice daily (400 +100 mg tablet).  Her last seizure occurred in 2019.  Since, she has not had recurrent seizure.  She is tolerating medication without side effect.  She reports before her seizure, she has an aura, feel the seizure coming on.  Her typical seizure is described as staring off, may last between 1 to 10 minutes.  She works at [employer] full time, she has a 1 year old daughter.  She has stopped drinking alcohol, because she had pre-seizure feeling afterwards. She is having hot flashes.  She presents today for evaluation unaccompanied.  HISTORY OF PRESENT ILLNESS:UPDATE 1/21/2020CM Ms.Connie Fuentes, 45 year old female returns for follow-up with history of complex partial seizure disorder.  She denies any seizures since last seen.  He remains on Tegretol extended release 500 mg twice daily.  She denies any falls any  balance issues.  She denies any daytime drowsiness.  She has had no interval medical issues.  She returns for reevaluation.  She continues to work full-time  REVIEW OF SYSTEMS: Out of a complete 14 system review of symptoms, the patient  complains only of the following symptoms, and all other reviewed systems are negative.  Seizures  ALLERGIES: No Known Allergies  HOME MEDICATIONS: Outpatient Medications Prior to Visit  Medication Sig Dispense Refill  . Black Cohosh 160 MG CAPS Take by mouth.    . Multiple Vitamin (MULTIVITAMIN ADULT PO) Take by mouth.    . carbamazepine (TEGRETOL XR) 400 MG 12 hr tablet TAKE 1 TABLET TWICE A DAY 180 tablet 3  . TEGRETOL-XR 100 MG 12 hr tablet TAKE 1 TABLET TWICE A DAY 180 tablet 0  . levonorgestrel (MIRENA) 20 MCG/24HR IUD 1 Intra Uterine Device (1 each total) by Intrauterine route once for 1 dose. 1 each 0   No facility-administered medications prior to visit.    PAST MEDICAL HISTORY: Past Medical History:  Diagnosis Date  . Fibroadenosis of breast    06/2010,08/2011;RT. Dr. Lemar Livings  . Seizure disorder (HCC)   . Seizures (HCC)     PAST SURGICAL HISTORY: Past Surgical History:  Procedure Laterality Date  . BREAST BIOPSY Right    2012?  Marland Kitchen BREAST SURGERY Right 06/2010   breast biopsy; fibroadenoma  . CESAREAN SECTION    . INTRAUTERINE DEVICE (IUD) INSERTION  10/19/2012   mirena  . none      FAMILY HISTORY: Family History  Problem Relation Age of Onset  . Diabetes Mother   . Hyperlipidemia Mother   . Hypertension Mother   . Pancreatic cancer Paternal Uncle 75  . Breast cancer Neg Hx     SOCIAL HISTORY: Social History   Socioeconomic History  . Marital status: Married  Spouse name: Connie Fuentes  . Number of children: 1  . Years of education: 55  . Highest education level: Not on file  Occupational History    Comment: [employer]  Tobacco Use  . Smoking status: Never Smoker  . Smokeless tobacco: Never Used  Vaping Use  . Vaping Use: Never used  Substance and Sexual Activity  . Alcohol use: Not Currently  . Drug use: No  . Sexual activity: Yes    Birth control/protection: I.U.D.    Comment: Mirena   Other Topics Concern  . Not on file  Social History  Narrative   Patient is married Connie Fuentes) and lives at home with her husband and child.   Patient is working full-time.   Patient has a Chief Operating Officer' degree   Patient is right-handed.   Patient drinks two cups of coffee daily.   Social Determinants of Health   Financial Resource Strain: Not on file  Food Insecurity: Not on file  Transportation Needs: Not on file  Physical Activity: Not on file  Stress: Not on file  Social Connections: Not on file  Intimate Partner Violence: Not on file   PHYSICAL EXAM  Vitals:   07/08/20 0841  BP: 124/70  Pulse: 77  Weight: 168 lb (76.2 kg)  Height: 5\' 11"  (1.803 m)   Body mass index is 23.43 kg/m.  Generalized: Well developed, in no acute distress   Neurological examination  Mentation: Alert oriented to time, place, history taking. Follows all commands speech and language fluent Cranial nerve II-XII: Pupils were equal round reactive to light. Extraocular movements were full, visual field were full on confrontational test. Facial sensation and strength were normal.  Head turning and shoulder shrug  were normal and symmetric. Motor: The motor testing reveals 5 over 5 strength of all 4 extremities. Good symmetric motor tone is noted throughout.  Sensory: Sensory testing is intact to soft touch on all 4 extremities. No evidence of extinction is noted.  Coordination: Cerebellar testing reveals good finger-nose-finger and heel-to-shin bilaterally.  Gait and station: Gait is normal. Tandem gait is normal.  Reflexes: Deep tendon reflexes are symmetric and normal bilaterally.   DIAGNOSTIC DATA (LABS, IMAGING, TESTING) - I reviewed patient records, labs, notes, testing and imaging myself where available.  Lab Results  Component Value Date   WBC 7.7 05/25/2020   HGB 12.9 05/25/2020   HCT 38.6 05/25/2020   MCV 91.5 05/25/2020   PLT 222 05/25/2020      Component Value Date/Time   NA 138 05/25/2020 0016   NA 143 05/09/2019 0856   K 3.0 (L)  05/25/2020 0016   CL 106 05/25/2020 0016   CO2 23 05/25/2020 0016   GLUCOSE 144 (H) 05/25/2020 0016   BUN 11 05/25/2020 0016   BUN 9 05/09/2019 0856   CREATININE 0.52 05/25/2020 0016   CALCIUM 8.6 (L) 05/25/2020 0016   PROT 6.2 (L) 05/25/2020 0016   PROT 6.4 05/09/2019 0856   ALBUMIN 3.8 05/25/2020 0016   ALBUMIN 4.6 05/09/2019 0856   AST 24 05/25/2020 0016   ALT 23 05/25/2020 0016   ALKPHOS 63 05/25/2020 0016   BILITOT 0.5 05/25/2020 0016   BILITOT 0.3 05/09/2019 0856   GFRNONAA >60 05/25/2020 0016   GFRAA 129 05/09/2019 0856   No results found for: CHOL, HDL, LDLCALC, LDLDIRECT, TRIG, CHOLHDL No results found for: 05/11/2019 No results found for: VITAMINB12 Lab Results  Component Value Date   TSH 2.650 02/06/2019   ASSESSMENT AND PLAN 45 y.o. year old  female  has a past medical history of Fibroadenosis of breast, Seizure disorder (HCC), and Seizures (HCC). here with:  1.  Seizures  -Continues to do well, last seizure was in 2019 -Continue Tegretol XR 500 mg twice daily (400+ 100 mg tablet) -Labs reviewed from recent ER visit, CBC unremarkable, CMP showed potassium 3.0 otherwise unremarkable, carbamazepine level was 9.0; had aggressive IV hydration, potassium replacement -Call for seizure activity, otherwise follow-up 1 year or sooner if needed  I spent 20 minutes of face-to-face and non-face-to-face time with patient.  This included previsit chart review, lab review, study review, order entry, electronic health record documentation, patient education.  Margie Ege, AGNP-C, DNP 07/08/2020, 9:17 AM [facility] [address], [address] [place], [place] [address] [phone]

## 2020-07-08 NOTE — Patient Instructions (Signed)
Continue current medications  Call for seizure activity  See you back in 1 year

## 2020-09-03 ENCOUNTER — Telehealth: Payer: Self-pay

## 2020-09-03 MED ORDER — NITROFURANTOIN MONOHYD MACRO 100 MG PO CAPS: 100.0000 mg | ORAL_CAPSULE | Freq: Two times a day (BID) | ORAL | 0 refills | Status: AC

## 2020-09-03 NOTE — Telephone Encounter (Signed)
Patient is calling to see if medication could be sent in for uti symptoms of lower abdominal pain, frequent urination, and pain with urination. Patient reports she did take of the counter AzO with no relief. Please advise

## 2020-09-03 NOTE — Telephone Encounter (Signed)
Rx macrobid eRxd. Hx of UTI, confirmed on C&S. F/u prn.

## 2021-02-13 ENCOUNTER — Other Ambulatory Visit (HOSPITAL_COMMUNITY)
Admission: RE | Admit: 2021-02-13 | Discharge: 2021-02-13 | Disposition: A | Discharge status: Home / Self Care | Payer: BC Managed Care – PPO | Source: Ambulatory Visit | Attending: Obstetrics and Gynecology | Admitting: Obstetrics and Gynecology

## 2021-02-13 ENCOUNTER — Other Ambulatory Visit: Payer: Self-pay

## 2021-02-13 ENCOUNTER — Ambulatory Visit (INDEPENDENT_AMBULATORY_CARE_PROVIDER_SITE_OTHER): Payer: BC Managed Care – PPO | Admitting: Obstetrics and Gynecology

## 2021-02-13 ENCOUNTER — Encounter: Payer: Self-pay | Admitting: Obstetrics and Gynecology

## 2021-02-13 VITALS — BP 102/80 | Ht 71.0 in | Wt 170.0 lb

## 2021-02-13 DIAGNOSIS — Z1151 Encounter for screening for human papillomavirus (HPV): Secondary | ICD-10-CM

## 2021-02-13 DIAGNOSIS — Z30431 Encounter for routine checking of intrauterine contraceptive device: Secondary | ICD-10-CM

## 2021-02-13 DIAGNOSIS — Z01419 Encounter for gynecological examination (general) (routine) without abnormal findings: Secondary | ICD-10-CM | POA: Diagnosis not present

## 2021-02-13 DIAGNOSIS — Z124 Encounter for screening for malignant neoplasm of cervix: Secondary | ICD-10-CM | POA: Insufficient documentation

## 2021-02-13 DIAGNOSIS — Z1231 Encounter for screening mammogram for malignant neoplasm of breast: Secondary | ICD-10-CM

## 2021-02-13 NOTE — Patient Instructions (Signed)
I value your feedback and you entrusting us with your care. If you get a [facility] [facility] patient survey, I would appreciate you taking the time to let us know about your experience today. Thank you! ? ? ?

## 2021-02-13 NOTE — Progress Notes (Signed)
PCP:  Albina Billet, MD   Chief Complaint  Patient presents with  . Gynecologic Exam    No concerns     HPI:      [name] is a 45 y.o. G1P1001 who LMP was No LMP recorded. (Menstrual status: IUD)., presents today for her annual examination.  Her menses are absent due to IUD. Dysmenorrhea none. She does not have intermenstrual bleeding.   Sex activity: single partner, contraception - IUD. Mirena replaced 09/27/17. Has some vag dryness, improved with lubricants. Last Pap: 01/19/17  Results were: no abnormalities /neg HPV DNA  Hx of STDs: none  Last mammo: 06/28/20 Results: normal, repeat in 12 months.  There is no FH of breast cancer. There is no FH of ovarian cancer. There is pancreatic cancer in her pat uncle, possible uterine vs cx in her mat aunt, just had hyst, no chemo/rad (pt to clarify). The patient does do self-breast exams.  Tobacco use: The patient denies current or previous tobacco use. Alcohol use: social No drug use.  Exercise: moderately active  She does get adequate calcium and Vitamin D in her diet.  Normal labs 1/20.  Past Medical History:  Diagnosis Date  . Fibroadenosis of breast    06/2010,08/2011;RT. Dr. Bary Castilla  . Seizure disorder ([place])   . Seizures ([place])     Past Surgical History:  Procedure Laterality Date  . BREAST BIOPSY Right    2012?  Marland Kitchen BREAST SURGERY Right 06/2010   breast biopsy; fibroadenoma  . CESAREAN SECTION    . INTRAUTERINE DEVICE (IUD) INSERTION  10/19/2012   mirena    Family History  Problem Relation Age of Onset  . Diabetes Mother   . Hyperlipidemia Mother   . Hypertension Mother   . Ovarian cancer Maternal Aunt 35  . Pancreatic cancer Paternal Uncle 64  . Breast cancer Neg Hx     Social History   Socioeconomic History  . Marital status: Married    Spouse name: [name]  . Number of children: 1  . Years of education: 15  . Highest education level: Not on file  Occupational History    Comment: [employer]   Tobacco Use  . Smoking status: Never  . Smokeless tobacco: Never  Vaping Use  . Vaping Use: Never used  Substance and Sexual Activity  . Alcohol use: Not Currently  . Drug use: No  . Sexual activity: Yes    Birth control/protection: I.U.D.    Comment: Mirena   Other Topics Concern  . Not on file  Social History Narrative   Patient is married [name]) and lives at home with her husband and child.   Patient is working full-time.   Patient has a Buyer, retail' degree   Patient is right-handed.   Patient drinks two cups of coffee daily.   Social Determinants of Health   Financial Resource Strain: Not on file  Food Insecurity: Not on file  Transportation Needs: Not on file  Physical Activity: Not on file  Stress: Not on file  Social Connections: Not on file  Intimate Partner Violence: Not on file    Current Meds  Medication Sig  . carbamazepine (TEGRETOL XR) 400 MG 12 hr tablet Take 1 tablet (400 mg total) by mouth 2 (two) times daily.  . carbamazepine (TEGRETOL-XR) 100 MG 12 hr tablet Take 1 tablet (100 mg total) by mouth 2 (two) times daily.  . Multiple Vitamin (MULTIVITAMIN ADULT PO) Take by mouth.     ROS:  Review of Systems  Constitutional:  Negative for fatigue, fever and unexpected weight change.  Respiratory:  Negative for cough, shortness of breath and wheezing.   Cardiovascular:  Negative for chest pain, palpitations and leg swelling.  Gastrointestinal:  Negative for blood in stool, constipation, diarrhea, nausea and vomiting.  Endocrine: Negative for cold intolerance, heat intolerance and polyuria.  Genitourinary:  Negative for dyspareunia, dysuria, flank pain, frequency, genital sores, hematuria, menstrual problem, pelvic pain, urgency, vaginal bleeding, vaginal discharge and vaginal pain.  Musculoskeletal:  Negative for back pain, joint swelling and myalgias.  Skin:  Negative for rash.  Neurological:  Negative for dizziness, syncope, light-headedness, numbness  and headaches.  Hematological:  Negative for adenopathy.  Psychiatric/Behavioral:  Negative for agitation, confusion, sleep disturbance and suicidal ideas. The patient is not nervous/anxious.     Objective: BP 102/80   Ht 5\' 11"  (1.803 m)   Wt 170 lb (77.1 kg)   BMI 23.71 kg/m    Physical Exam Constitutional:      Appearance: She is well-developed.  Genitourinary:     Vulva normal.     No vaginal discharge, erythema or tenderness.      Right Adnexa: not tender and no mass present.    Left Adnexa: not tender and no mass present.    No cervical motion tenderness or polyp.     IUD strings visualized.     Uterus is not enlarged or tender.  Breasts:    Right: No mass, Fuentes discharge, skin change or tenderness.     Left: No mass, Fuentes discharge, skin change or tenderness.  Neck:     Thyroid: No thyromegaly.  Cardiovascular:     Rate and Rhythm: Normal rate and regular rhythm.     Heart sounds: Normal heart sounds. No murmur heard. Pulmonary:     Effort: Pulmonary effort is normal.     Breath sounds: Normal breath sounds.  Abdominal:     Palpations: Abdomen is soft.     Tenderness: There is no abdominal tenderness. There is no guarding.  Musculoskeletal:        General: Normal range of motion.     Cervical back: Normal range of motion.  Neurological:     General: No focal deficit present.     Mental Status: She is alert and oriented to person, place, and time.     Cranial Nerves: No cranial nerve deficit.  Skin:    General: Skin is warm and dry.  Psychiatric:        Mood and Affect: Mood normal.        Behavior: Behavior normal.        Thought Content: Thought content normal.        Judgment: Judgment normal.  Vitals reviewed.     Assessment/Plan:  Encounter for annual routine gynecological examination  Cervical cancer screening - Plan: Cytology - PAP  Screening for HPV (human papillomavirus) - Plan: Cytology - PAP  Encounter for routine checking of  intrauterine contraceptive device (IUD)--IUD strings in cx os, has 8 yr indication now  Encounter for screening mammogram for malignant neoplasm of breast - Plan: MM 3D SCREEN BREAST BILATERAL, pt to sched mammo     GYN counsel mammography screening, adequate intake of calcium and vitamin D, diet and exercise     F/U  Return in about 1 year (around 02/13/2022).  Nyeisha Goodall B. Gudelia Eugene, PA-C 02/13/2021 10:03 AM

## 2021-02-17 LAB — CYTOLOGY - PAP
Comment: NEGATIVE
Diagnosis: NEGATIVE
High risk HPV: NEGATIVE

## 2021-06-09 ENCOUNTER — Other Ambulatory Visit: Payer: Self-pay | Admitting: Neurology

## 2021-06-09 NOTE — Telephone Encounter (Signed)
Rx refilled.

## 2021-06-26 ENCOUNTER — Other Ambulatory Visit: Payer: Self-pay | Admitting: Obstetrics and Gynecology

## 2021-07-08 ENCOUNTER — Ambulatory Visit
Admission: RE | Admit: 2021-07-08 | Discharge: 2021-07-08 | Disposition: A | Discharge status: Home / Self Care | Payer: BC Managed Care – PPO | Source: Ambulatory Visit

## 2021-07-08 ENCOUNTER — Ambulatory Visit: Payer: BC Managed Care – PPO | Admitting: Neurology

## 2021-07-17 ENCOUNTER — Ambulatory Visit: Payer: BC Managed Care – PPO | Admitting: Neurology

## 2021-09-01 ENCOUNTER — Encounter: Payer: Self-pay | Admitting: Neurology

## 2021-09-01 ENCOUNTER — Ambulatory Visit: Payer: BC Managed Care – PPO | Admitting: Neurology

## 2021-09-01 VITALS — BP 123/75 | HR 63 | Ht 71.0 in | Wt 175.0 lb

## 2021-09-01 DIAGNOSIS — G40209 Localization-related (focal) (partial) symptomatic epilepsy and epileptic syndromes with complex partial seizures, not intractable, without status epilepticus: Secondary | ICD-10-CM | POA: Diagnosis not present

## 2021-09-01 MED ORDER — CARBAMAZEPINE ER 100 MG PO TB12: 100.0000 mg | ORAL_TABLET | Freq: Two times a day (BID) | ORAL | 4 refills | Status: DC

## 2021-09-01 MED ORDER — CARBAMAZEPINE ER 400 MG PO TB12: 400.0000 mg | ORAL_TABLET | Freq: Two times a day (BID) | ORAL | 4 refills | Status: DC

## 2021-09-01 NOTE — Progress Notes (Signed)
PATIENT: Connie Fuentes DOB: 1975-06-15  REASON FOR VISIT: follow up for seizures HISTORY FROM: patient PRIMARY NEUROLOGIST: Dr. Dalia Heading. [place]   HISTORY OF PRESENT ILLNESS: Today 09/01/21 Connie Fuentes is here today for follow-up.  Remains on Tegretol.  Last seizure was in 2019.  Doing overall well.  She is going through menopause, having hot flashes,felt related to syncope last year with dehydration, none since. Sometimes headaches, not significant, relieved with OTC medications. Works full-time.  Has a 63 year old daughter.   Update 07/08/2020 SS: Connie Fuentes is a 46 year old female with history of complex partial seizure disorder she remains on Tegretol twice daily.  Her last seizure was in 2019. Before her seizures, she has an aura.  Typical seizures are described as staring off.  No longer drinks alcohol, due to a preseizure feeling afterwards. She had a syncopal episode back in February, related to dehydration, diarrhea, hypokalemia.  She quickly improved with hydration.  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  Presents today for evaluation unaccompanied.  Update 05/09/2019 SS:Connie Fuentes is a 46 year old female with history of complex partial seizure disorder.  She remains on Tegretol XR 500 mg twice daily (400 +100 mg tablet).  Her last seizure occurred in 2019.  Since, she has not had recurrent seizure.  She is tolerating medication without side effect.  She reports before her seizure, she has an aura, feel the seizure coming on.  Her typical seizure is described as staring off, may last between 1 to 10 minutes.  She works at [employer] full time, she has a 85 year old daughter.  She has stopped drinking alcohol, because she had pre-seizure feeling afterwards. She is having hot flashes.  She presents today for evaluation unaccompanied.  HISTORY OF PRESENT ILLNESS:UPDATE 1/21/2020CM Connie Fuentes, 46 year old female returns for  follow-up with history of complex partial seizure disorder.  She denies any seizures since last seen.  He remains on Tegretol extended release 500 mg twice daily.  She denies any falls any  balance issues.  She denies any daytime drowsiness.  She has had no interval medical issues.  She returns for reevaluation.  She continues to work full-time  REVIEW OF SYSTEMS: Out of a complete 14 system review of symptoms, the patient complains only of the following symptoms, and all other reviewed systems are negative.  See HPI  ALLERGIES: No Known Allergies  HOME MEDICATIONS: Outpatient Medications Prior to Visit  Medication Sig Dispense Refill   Multiple Vitamin (MULTIVITAMIN ADULT PO) Take by mouth.     carbamazepine (TEGRETOL XR) 400 MG 12 hr tablet TAKE 1 TABLET TWICE A DAY 180 tablet 1   carbamazepine (TEGRETOL-XR) 100 MG 12 hr tablet Take 1 tablet (100 mg total) by mouth 2 (two) times daily. 180 tablet 4   levonorgestrel (MIRENA) 20 MCG/24HR IUD 1 Intra Uterine Device (1 each total) by Intrauterine route once for 1 dose. 1 each 0   No facility-administered medications prior to visit.    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 Dr. Lemar Livings   Seizure disorder [facility])    Seizures [facility])     PAST SURGICAL HISTORY: Past Surgical History:  Procedure Laterality Date   BREAST BIOPSY Right    2012?   BREAST SURGERY Right 06/2010   breast biopsy; fibroadenoma   CESAREAN SECTION     INTRAUTERINE DEVICE (IUD) INSERTION  10/19/2012  mirena    FAMILY HISTORY: Family History  Problem Relation Age of Onset   Diabetes Mother    Hyperlipidemia Mother    Hypertension Mother    Cancer Maternal Aunt 35       question cx, uterine or ovar; no chemo/rad   Pancreatic cancer Paternal Uncle 4560   Breast cancer Neg Hx     SOCIAL HISTORY: Social History   Socioeconomic History   Marital status: Married    Spouse name: Molly MaduroRobert    Number of children: 1   Years of education: 16   Highest education level: Not on file  Occupational History    Comment: [employer]  Tobacco Use   Smoking status: Never   Smokeless tobacco: Never  Vaping Use   Vaping Use: Never used  Substance and Sexual Activity   Alcohol use: Not Currently   Drug use: No   Sexual activity: Yes    Birth control/protection: I.U.D.    Comment: Mirena   Other Topics Concern   Not on file  Social History Narrative   Patient is married [name](Robert) and lives at home with her husband and child.   Patient is working full-time.   Patient has a Chief Operating OfficerBachelors' degree   Patient is right-handed.   Patient drinks two cups of coffee daily.   Social Determinants of Health   Financial Resource Strain: Not on file  Food Insecurity: Not on file  Transportation Needs: Not on file  Physical Activity: Not on file  Stress: Not on file  Social Connections: Not on file  Intimate Partner Violence: Not on file   PHYSICAL EXAM  Vitals:   09/01/21 0849  BP: 123/75  Pulse: 63  Weight: 175 lb (79.4 kg)  Height: 5\' 11"  (1.803 m)    Body mass index is 24.41 kg/m.  Generalized: Well developed, in no acute distress   Neurological examination  Mentation: Alert oriented to time, place, history taking. Follows all commands speech and language fluent Cranial nerve II-XII: Pupils were equal round reactive to light. Extraocular movements were full, visual field were full on confrontational test. Facial sensation and strength were normal.  Head turning and shoulder shrug  were normal and symmetric. Motor: The motor testing reveals 5 over 5 strength of all 4 extremities. Good symmetric motor tone is noted throughout.  Sensory: Sensory testing is intact to soft touch on all 4 extremities. No evidence of extinction is noted.  Coordination: Cerebellar testing reveals good finger-nose-finger and heel-to-shin bilaterally.  Gait and station: Gait is normal. Tandem gait is normal.   Reflexes: Deep tendon reflexes are symmetric and normal bilaterally.   DIAGNOSTIC DATA (LABS, IMAGING, TESTING) - I reviewed patient records, labs, notes, testing and imaging myself where available.  Lab Results  Component Value Date   WBC 7.7 05/25/2020   HGB 12.9 05/25/2020   HCT 38.6 05/25/2020   MCV 91.5 05/25/2020   PLT 222 05/25/2020      Component Value Date/Time   NA 138 05/25/2020 0016   NA 143 05/09/2019 0856   K 3.0 (L) 05/25/2020 0016   CL 106 05/25/2020 0016   CO2 23 05/25/2020 0016   GLUCOSE 144 (H) 05/25/2020 0016   BUN 11 05/25/2020 0016   BUN 9 05/09/2019 0856   CREATININE 0.52 05/25/2020 0016   CALCIUM 8.6 (L) 05/25/2020 0016   PROT 6.2 (L) 05/25/2020 0016   PROT 6.4 05/09/2019 0856   ALBUMIN 3.8 05/25/2020 0016   ALBUMIN 4.6 05/09/2019 0856   AST 24  05/25/2020 0016   ALT 23 05/25/2020 0016   ALKPHOS 63 05/25/2020 0016   BILITOT 0.5 05/25/2020 0016   BILITOT 0.3 05/09/2019 0856   GFRNONAA >60 05/25/2020 0016   GFRAA 129 05/09/2019 0856   No results found for: CHOL, HDL, LDLCALC, LDLDIRECT, TRIG, CHOLHDL No results found for: VZDG3O No results found for: VITAMINB12 Lab Results  Component Value Date   TSH 2.650 02/06/2019   ASSESSMENT AND PLAN 46 y.o. year old female   1.  Seizures  Last seizure was in 2019 -Continue Tegretol XR 500 mg twice daily (400+ 100 mg tablet) -Check routine labs today -Call for seizure activity, otherwise follow-up 1 year or sooner if needed, 15 min VV  Meds ordered this encounter  Medications   carbamazepine (TEGRETOL-XR) 100 MG 12 hr tablet    Sig: Take 1 tablet (100 mg total) by mouth 2 (two) times daily.    Dispense:  180 tablet    Refill:  4   carbamazepine (TEGRETOL XR) 400 MG 12 hr tablet    Sig: Take 1 tablet (400 mg total) by mouth 2 (two) times daily.    Dispense:  180 tablet    Refill:  4   Margie Ege, [place], [place] 09/01/2021, 9:06 AM [facility] [address] [place], [place] [address] [phone]

## 2021-09-01 NOTE — Patient Instructions (Signed)
Meds ordered this encounter  Medications   carbamazepine (TEGRETOL-XR) 100 MG 12 hr tablet    Sig: Take 1 tablet (100 mg total) by mouth 2 (two) times daily.    Dispense:  180 tablet    Refill:  4   carbamazepine (TEGRETOL XR) 400 MG 12 hr tablet    Sig: Take 1 tablet (400 mg total) by mouth 2 (two) times daily.    Dispense:  180 tablet    Refill:  4   Orders Placed This Encounter  Procedures   CBC with Differential/Platelet   CMP   Carbamazepine level, total

## 2021-09-02 LAB — CBC WITH DIFFERENTIAL/PLATELET
Basophils Absolute: 0 10*3/uL (ref 0.0–0.2)
Basos: 1 %
EOS (ABSOLUTE): 0.2 10*3/uL (ref 0.0–0.4)
Eos: 4 %
Hematocrit: 40.4 % (ref 34.0–46.6)
Hemoglobin: 13.4 g/dL (ref 11.1–15.9)
Immature Grans (Abs): 0 10*3/uL (ref 0.0–0.1)
Immature Granulocytes: 0 %
Lymphocytes Absolute: 1.8 10*3/uL (ref 0.7–3.1)
Lymphs: 43 %
MCH: 30.6 pg (ref 26.6–33.0)
MCHC: 33.2 g/dL (ref 31.5–35.7)
MCV: 92 fL (ref 79–97)
Monocytes Absolute: 0.5 10*3/uL (ref 0.1–0.9)
Monocytes: 12 %
Neutrophils Absolute: 1.6 10*3/uL (ref 1.4–7.0)
Neutrophils: 40 %
Platelets: 266 10*3/uL (ref 150–450)
RBC: 4.38 x10E6/uL (ref 3.77–5.28)
RDW: 11.8 % (ref 11.7–15.4)
WBC: 4.1 10*3/uL (ref 3.4–10.8)

## 2021-09-02 LAB — COMPREHENSIVE METABOLIC PANEL
ALT: 27 IU/L (ref 0–32)
AST: 21 IU/L (ref 0–40)
Albumin/Globulin Ratio: 2.3 — ABNORMAL HIGH (ref 1.2–2.2)
Albumin: 4.5 g/dL (ref 3.8–4.8)
Alkaline Phosphatase: 86 IU/L (ref 44–121)
BUN/Creatinine Ratio: 18 (ref 9–23)
BUN: 11 mg/dL (ref 6–24)
Bilirubin Total: <0.2 mg/dL (ref 0.0–1.2)
CO2: 26 mmol/L (ref 20–29)
Calcium: 9.6 mg/dL (ref 8.7–10.2)
Chloride: 102 mmol/L (ref 96–106)
Creatinine, Ser: 0.62 mg/dL (ref 0.57–1.00)
Globulin, Total: 2 g/dL (ref 1.5–4.5)
Glucose: 59 mg/dL — ABNORMAL LOW (ref 70–99)
Potassium: 4.2 mmol/L (ref 3.5–5.2)
Sodium: 141 mmol/L (ref 134–144)
Total Protein: 6.5 g/dL (ref 6.0–8.5)
eGFR: 112 mL/min/{1.73_m2} (ref >59)

## 2021-09-02 LAB — CARBAMAZEPINE LEVEL, TOTAL: Carbamazepine (Tegretol), S: 10.8 ug/mL (ref 4.0–12.0)

## 2021-11-04 IMAGING — MG DIGITAL SCREENING BILAT W/ TOMO W/ CAD
8 series · 8 of 24 positions shown · non-contrast
Comparison: Previous exam(s).

CLINICAL DATA: Screening.

EXAM:
DIGITAL SCREENING BILATERAL MAMMOGRAM WITH TOMO AND CAD

[R CC synth-2D]
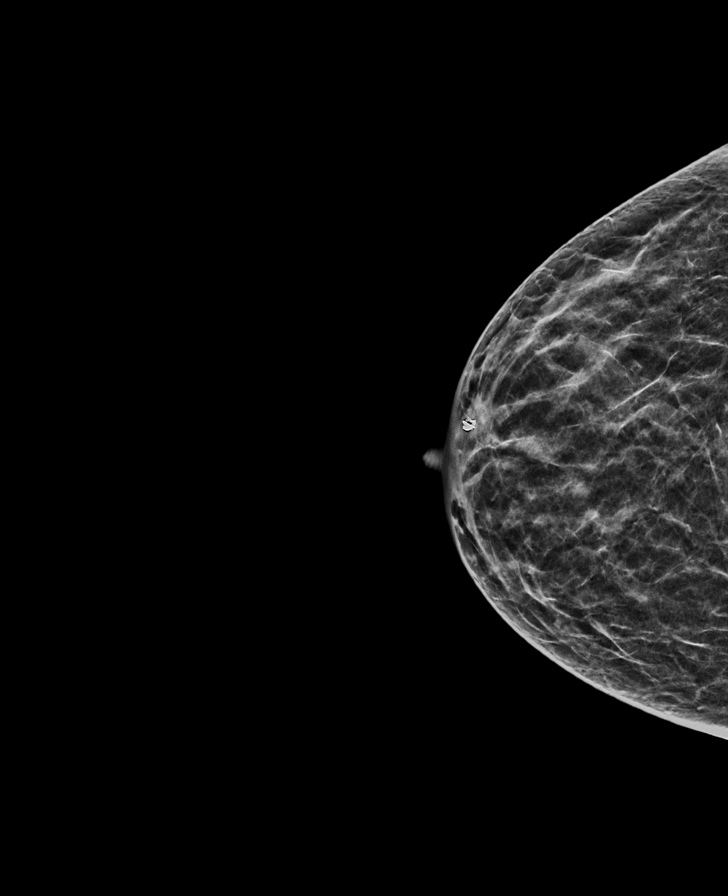

[R MLO synth-2D]
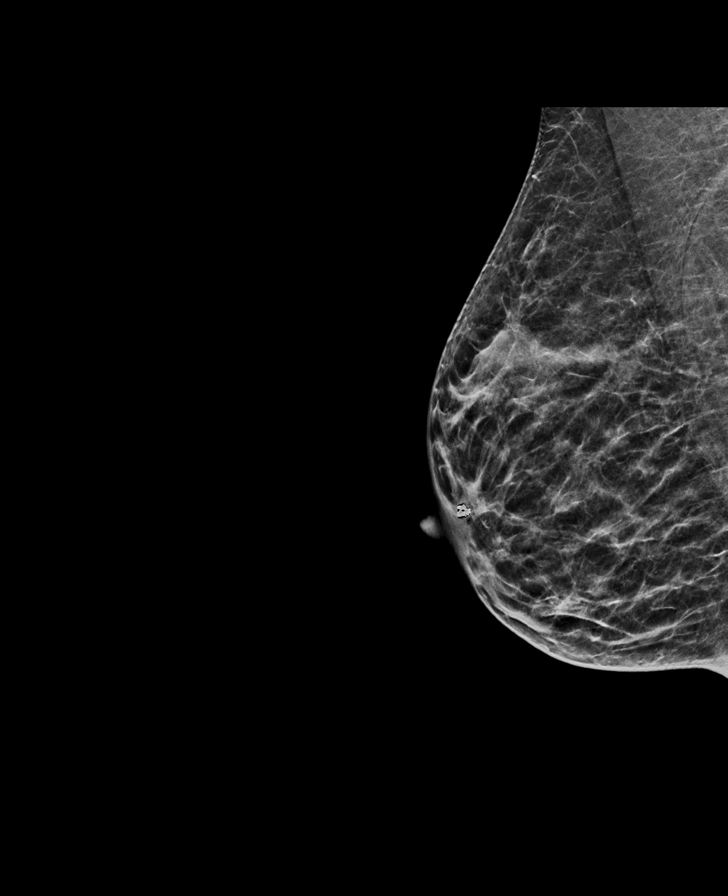

[L MLO synth-2D]
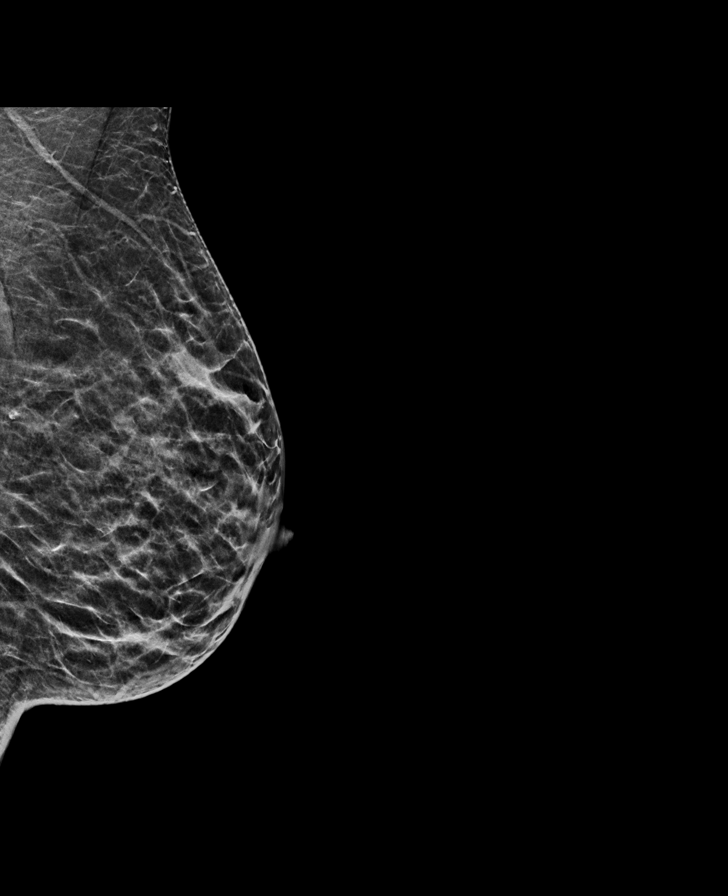

[L CC synth-2D]
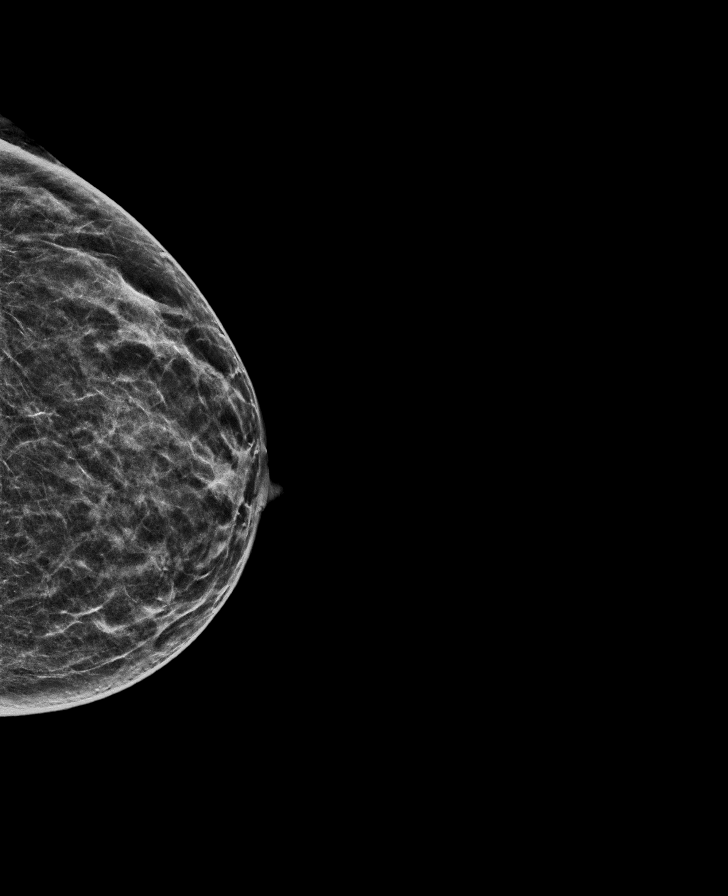

[R CC tomo · tomo slice 23/46.0]
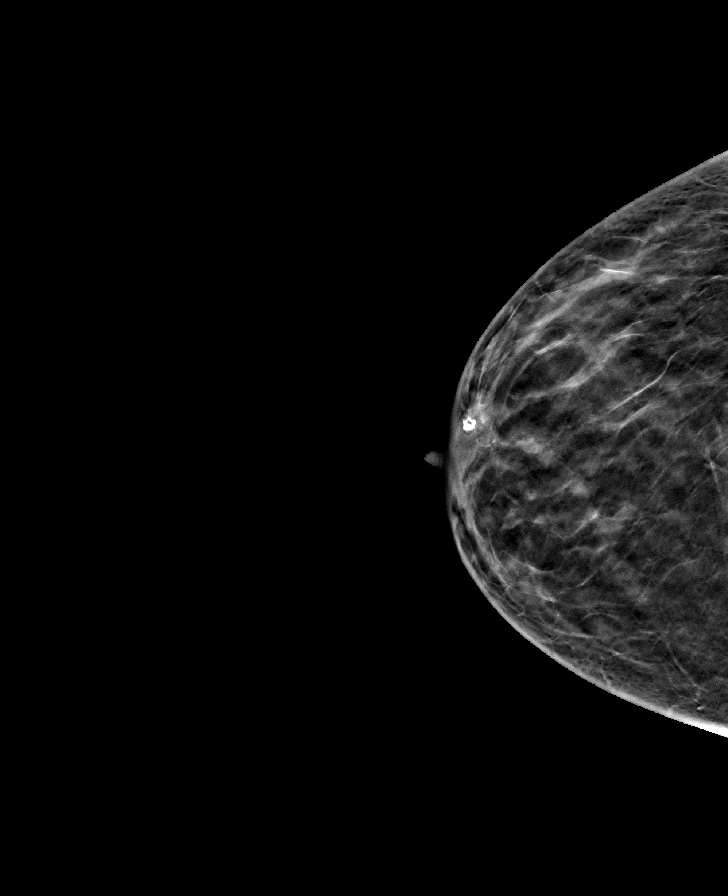

[L CC tomo · tomo slice 25/48.0]
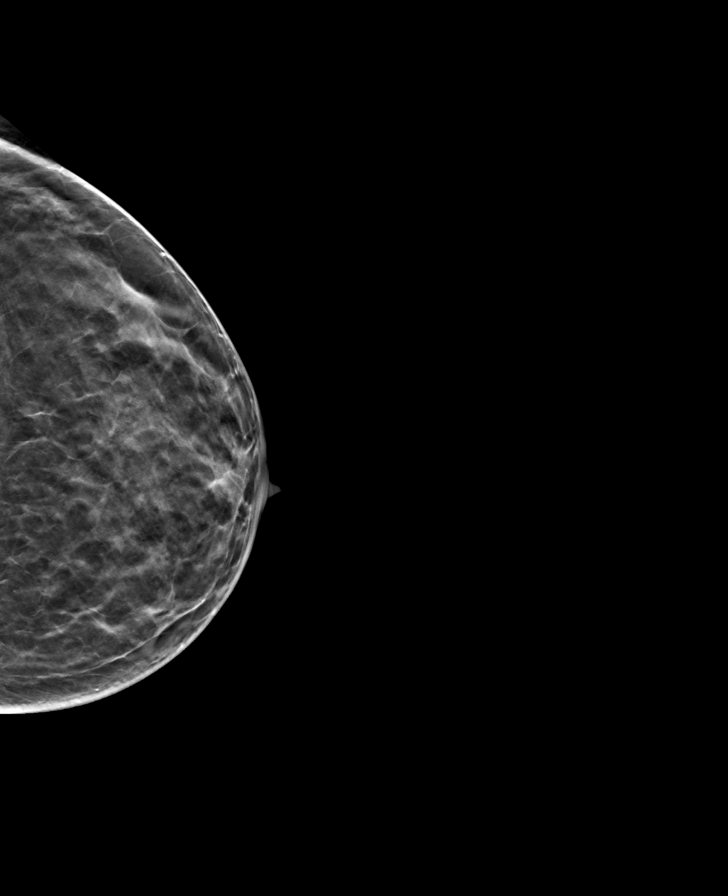

[R MLO tomo · tomo slice 25/48.0]
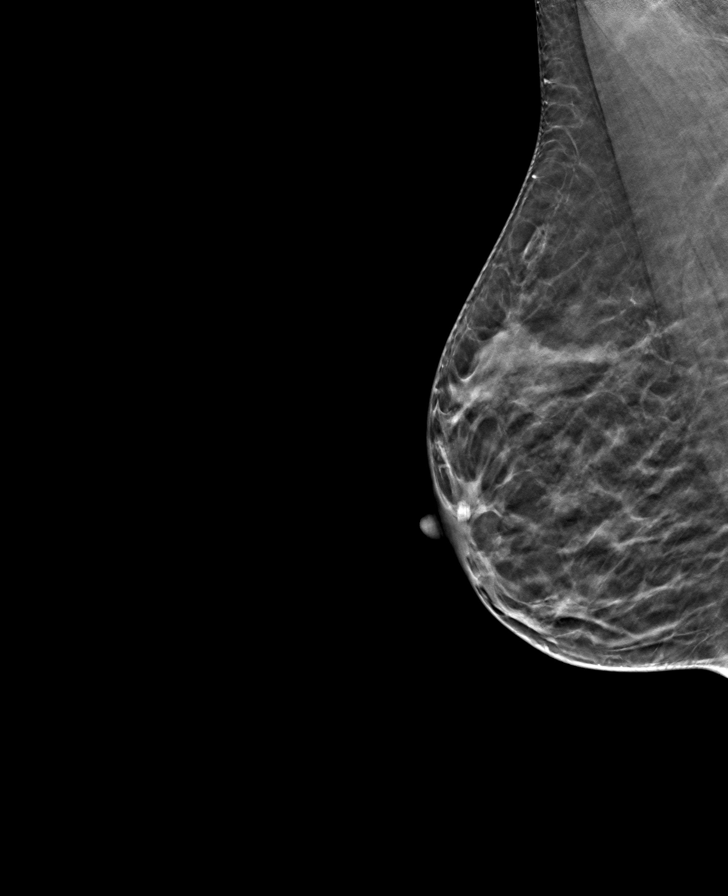

[L MLO tomo · tomo slice 23/46.0]
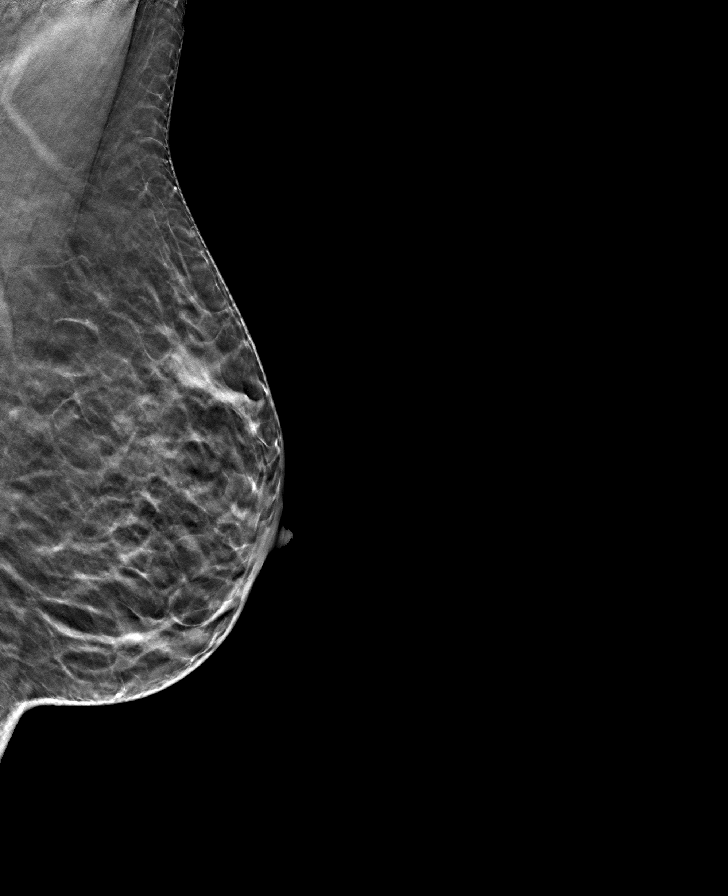

[8 of 24 positions shown; findings below may reference images not displayed]

ACR Breast Density Category c: The breast tissue is heterogeneously
dense, which may obscure small masses.
FINDINGS: There are no findings suspicious for malignancy. Images were
processed with CAD.
IMPRESSION: No mammographic evidence of malignancy. A result letter of this
screening mammogram will be mailed directly to the patient.

RECOMMENDATION:
Screening mammogram in one year. (Code:[id number]-[id number])

BI-RADS CATEGORY  1: Negative.

## 2022-03-06 ENCOUNTER — Other Ambulatory Visit: Payer: Self-pay | Admitting: Neurology

## 2022-03-25 ENCOUNTER — Other Ambulatory Visit: Payer: Self-pay | Admitting: Neurology

## 2022-03-25 ENCOUNTER — Telehealth: Payer: Self-pay | Admitting: Neurology

## 2022-03-25 MED ORDER — CARBAMAZEPINE ER 400 MG PO TB12: 400.0000 mg | ORAL_TABLET | Freq: Two times a day (BID) | ORAL | 4 refills | Status: DC

## 2022-03-25 NOTE — Telephone Encounter (Signed)
Refill for Carbamazepine 400mg  sent to [facility], please update pt.  Thanks!

## 2022-03-25 NOTE — Addendum Note (Signed)
Addended by: Ann Maki on: 03/25/2022 11:25 AM   Modules accepted: Orders

## 2022-03-25 NOTE — Telephone Encounter (Signed)
Pt states [facility] asked her to reach out to office re: the needed refill for her carbamazepine (TEGRETOL XR) 400 MG 12 hr tablet  to [facility]

## 2022-04-28 ENCOUNTER — Telehealth: Payer: Self-pay | Admitting: Neurology

## 2022-04-28 NOTE — Telephone Encounter (Signed)
LVM and sent mychart msg informing pt of need to reschedule 09/02/22 appointment - NP out 

## 2022-05-13 ENCOUNTER — Telehealth: Payer: Self-pay | Admitting: Neurology

## 2022-05-13 MED ORDER — CARBAMAZEPINE ER 400 MG PO TB12: 400.0000 mg | ORAL_TABLET | Freq: Two times a day (BID) | ORAL | 1 refills | Status: DC

## 2022-05-13 NOTE — Telephone Encounter (Signed)
Pt is needing her carbamazepine (TEGRETOL XR) 400 MG 12 hr tablet refill sent to the [facility]

## 2022-05-13 NOTE — Telephone Encounter (Signed)
Rx sent as per last office visit note. 

## 2022-05-20 ENCOUNTER — Telehealth: Payer: Self-pay | Admitting: Neurology

## 2022-05-20 NOTE — Telephone Encounter (Signed)
Pt said refill for carbamazepine (TEGRETOL XR) 400 MG 12 hr tablet has been put on hold. Pharmacy said need a PA or new prescription.

## 2022-05-22 ENCOUNTER — Encounter: Payer: Self-pay | Admitting: Neurology

## 2022-05-27 NOTE — Telephone Encounter (Signed)
Patient has reached out again in regards to the PA needed for the 459m. The 100 has been approved but no update on the 4066m Could you please look into? Thanks

## 2022-06-02 ENCOUNTER — Other Ambulatory Visit (HOSPITAL_COMMUNITY): Payer: Self-pay

## 2022-06-04 ENCOUNTER — Other Ambulatory Visit: Payer: Self-pay

## 2022-06-04 ENCOUNTER — Other Ambulatory Visit (HOSPITAL_COMMUNITY): Payer: Self-pay

## 2022-06-04 MED ORDER — CARBAMAZEPINE ER 400 MG PO TB12: 400.0000 mg | ORAL_TABLET | Freq: Two times a day (BID) | ORAL | 1 refills | Status: DC

## 2022-06-04 MED ORDER — CARBAMAZEPINE ER 100 MG PO TB12: 100.0000 mg | ORAL_TABLET | Freq: Two times a day (BID) | ORAL | 4 refills | Status: DC

## 2022-06-04 NOTE — Telephone Encounter (Signed)
Pt called and stated she is following-up on the PA for Tegretol SR 400MG. Pt is requesting a call back.

## 2022-06-04 NOTE — Telephone Encounter (Signed)
Pt called and wants to know what pharmacy her Tegretol SR 400MG was sent to.

## 2022-06-04 NOTE — Telephone Encounter (Signed)
Pt called and stated she she needs a refill on carbamazepine (TEGRETOL XR) 400 MG 12 hr tablet. [id number]

## 2022-08-14 ENCOUNTER — Ambulatory Visit: Payer: BC Managed Care – PPO

## 2022-08-24 ENCOUNTER — Other Ambulatory Visit: Payer: Self-pay | Admitting: Obstetrics and Gynecology

## 2022-08-24 DIAGNOSIS — Z1231 Encounter for screening mammogram for malignant neoplasm of breast: Secondary | ICD-10-CM

## 2022-09-02 ENCOUNTER — Telehealth: Payer: BC Managed Care – PPO | Admitting: Neurology

## 2022-09-16 ENCOUNTER — Telehealth (INDEPENDENT_AMBULATORY_CARE_PROVIDER_SITE_OTHER): Payer: BC Managed Care – PPO | Admitting: Neurology

## 2022-09-16 DIAGNOSIS — R569 Unspecified convulsions: Secondary | ICD-10-CM | POA: Diagnosis not present

## 2022-09-16 DIAGNOSIS — G40209 Localization-related (focal) (partial) symptomatic epilepsy and epileptic syndromes with complex partial seizures, not intractable, without status epilepticus: Secondary | ICD-10-CM

## 2022-09-16 MED ORDER — CARBAMAZEPINE ER 400 MG PO TB12: 400.0000 mg | ORAL_TABLET | Freq: Two times a day (BID) | ORAL | 4 refills | Status: DC

## 2022-09-16 MED ORDER — CARBAMAZEPINE ER 100 MG PO TB12: 100.0000 mg | ORAL_TABLET | Freq: Two times a day (BID) | ORAL | 4 refills | Status: DC

## 2022-09-16 NOTE — Progress Notes (Signed)
   Virtual Visit via Video Note  I connected with Connie Fuentes on 09/16/22 at  2:30 PM EDT by a video enabled telemedicine application and verified that I am speaking with the correct person using two identifiers.  Location: Patient: at her home  Provider: in the office    I discussed the limitations of evaluation and management by telemedicine and the availability of in person appointments. The patient expressed understanding and agreed to proceed.  History of Present Illness: Today 09/16/22 SS: Here today via virtual visit, continues to do well.  No recent seizures.  Remains on carbamazepine 12 hr tablet 500 mg twice daily.  Tolerates well.  No new health issues.  Labs in May 2023 showed carbamazepine level 10.8, CBC and CMP were unremarkable.  No new issues or concerns.  09/01/21 SS: Connie Fuentes is here today for follow-up.  Remains on Tegretol.  Last seizure was in 2019.  Doing overall well.  She is going through menopause, having hot flashes,felt related to syncope last year with dehydration, none since. Sometimes headaches, not significant, relieved with OTC medications. Works full-time.  Has a 53 year old daughter.    Observations/Objective: Via video visit is alert and oriented, speech is clear and concise, facial symmetry noted, moves about freely  Assessment and Plan: 1.  Seizures -Last seizure in 2019 -Continue generic carbamazepine 12-hour tablet 100+400 mg = 500 mg twice daily -Will send orders to [facility] for routine labs  [facility] [address] [place], [place] [address] [place] [phone]: [phone]  Orders Placed This Encounter  Procedures   CBC with Differential/Platelet   CMP   Vitamin D, 25-hydroxy   Carbamazepine level, total    Meds ordered this encounter  Medications   carbamazepine (TEGRETOL-XR) 100 MG 12 hr tablet    Sig: Take 1 tablet (100 mg total) by mouth 2 (two) times daily.    Dispense:  180 tablet    Refill:  4   carbamazepine  (TEGRETOL XR) 400 MG 12 hr tablet    Sig: Take 1 tablet (400 mg total) by mouth 2 (two) times daily.    Dispense:  180 tablet    Refill:  4    Follow Up Instructions: 1 year   I discussed the assessment and treatment plan with the patient. The patient was provided an opportunity to ask questions and all were answered. The patient agreed with the plan and demonstrated an understanding of the instructions.   The patient was advised to call back or seek an in-person evaluation if the symptoms worsen or if the condition fails to improve as anticipated.    Otila Kluver, DNP  [facility] [address] [place], [place] [address] [phone]

## 2022-09-16 NOTE — Patient Instructions (Addendum)
Great to see you! I have sent orders to your local [facility], please go to have blood work done  [facility] [address] [place], [place] [address] [place] [phone]: [phone]  Continue medications, please let me know if any seizures  See you in 1 year!  Meds ordered this encounter  Medications   carbamazepine (TEGRETOL-XR) 100 MG 12 hr tablet    Sig: Take 1 tablet (100 mg total) by mouth 2 (two) times daily.    Dispense:  180 tablet    Refill:  4   carbamazepine (TEGRETOL XR) 400 MG 12 hr tablet    Sig: Take 1 tablet (400 mg total) by mouth 2 (two) times daily.    Dispense:  180 tablet    Refill:  4

## 2022-09-22 DIAGNOSIS — Z1231 Encounter for screening mammogram for malignant neoplasm of breast: Secondary | ICD-10-CM

## 2022-10-15 NOTE — Progress Notes (Signed)
PCP:  Jaclyn Shaggy, MD   Chief Complaint  Patient presents with   Gynecologic Exam    No concerns     HPI:      [name] is a 47 y.o. G1P1001 who LMP was No LMP recorded. (Menstrual status: IUD)., presents today for her annual examination.  Her menses are absent due to IUD. Dysmenorrhea none. She does not have intermenstrual bleeding. Has occas vasomotor sx now, takes estroven for sleep with some relief.   Sex activity: single partner, contraception - IUD. Mirena replaced 09/27/17. Has some vag dryness, improved with lubricants. No bleeding. Last Pap: 02/13/21  Results were: no abnormalities /neg HPV DNA  Hx of STDs: none  Last mammo: 07/08/21 Results: normal, repeat in 12 months. Has appt 7/24 There is no FH of breast cancer. There is no FH of ovarian cancer. There is pancreatic cancer in her pat uncle, and uterine cancer in her mat aunt, just had hyst, no chemo/rad. The patient does do self-breast exams.  Tobacco use: The patient denies current or previous tobacco use. Alcohol use: social No drug use.  Exercise: very active  Colonoscopy: never  She does get adequate calcium and Vitamin D in her diet.  Labs with PCP  Past Medical History:  Diagnosis Date   Fibroadenosis of breast    06/2010,08/2011;RT. Dr. Lemar Livings   Seizure disorder [facility])    Seizures [facility])     Past Surgical History:  Procedure Laterality Date   BREAST BIOPSY Right    2012?   BREAST SURGERY Right 06/2010   breast biopsy; fibroadenoma   CESAREAN SECTION     INTRAUTERINE DEVICE (IUD) INSERTION  10/19/2012   mirena    Family History  Problem Relation Age of Onset   Diabetes Mother    Hyperlipidemia Mother    Hypertension Mother    Cancer Maternal Aunt 35       question cx, uterine or ovar; no chemo/rad   Pancreatic cancer Paternal Uncle 66   Breast cancer Neg Hx     Social History   Socioeconomic History   Marital status: Married    Spouse name: Connie Fuentes   Number of children: 1    Years of education: 16   Highest education level: Not on file  Occupational History    Comment: [employer]  Tobacco Use   Smoking status: Never   Smokeless tobacco: Never  Vaping Use   Vaping Use: Never used  Substance and Sexual Activity   Alcohol use: Not Currently   Drug use: No   Sexual activity: Yes    Birth control/protection: I.U.D.    Comment: Mirena   Other Topics Concern   Not on file  Social History Narrative   Patient is married Connie Fuentes) and lives at home with her husband and child.   Patient is working full-time.   Patient has a Chief Operating Officer' degree   Patient is right-handed.   Patient drinks two cups of coffee daily.   Social Determinants of Health   Financial Resource Strain: Not on file  Food Insecurity: Not on file  Transportation Needs: Not on file  Physical Activity: Not on file  Stress: Not on file  Social Connections: Not on file  Intimate Partner Violence: Not on file    Current Meds  Medication Sig   carbamazepine (TEGRETOL XR) 400 MG 12 hr tablet Take 1 tablet (400 mg total) by mouth 2 (two) times daily.   carbamazepine (TEGRETOL-XR) 100 MG 12 hr tablet  Take 1 tablet (100 mg total) by mouth 2 (two) times daily.   Multiple Vitamin (MULTIVITAMIN ADULT PO) Take by mouth.     ROS:  Review of Systems  Constitutional:  Negative for fatigue, fever and unexpected weight change.  Respiratory:  Negative for cough, shortness of breath and wheezing.   Cardiovascular:  Negative for chest pain, palpitations and leg swelling.  Gastrointestinal:  Negative for blood in stool, constipation, diarrhea, nausea and vomiting.  Endocrine: Negative for cold intolerance, heat intolerance and polyuria.  Genitourinary:  Negative for dyspareunia, dysuria, flank pain, frequency, genital sores, hematuria, menstrual problem, pelvic pain, urgency, vaginal bleeding, vaginal discharge and vaginal pain.  Musculoskeletal:  Negative for back pain, joint swelling and myalgias.   Skin:  Negative for rash.  Neurological:  Negative for dizziness, syncope, light-headedness, numbness and headaches.  Hematological:  Negative for adenopathy.  Psychiatric/Behavioral:  Negative for agitation, confusion, sleep disturbance and suicidal ideas. The patient is not nervous/anxious.      Objective: BP 100/70   Ht 5\' 11"  (1.803 m)   Wt 172 lb (78 kg)   BMI 23.99 kg/m    Physical Exam Constitutional:      Appearance: She is well-developed.  Genitourinary:     Vulva normal.     Right Labia: No rash, tenderness or lesions.    Left Labia: No tenderness, lesions or rash.    No vaginal discharge, erythema or tenderness.     Mild vaginal atrophy present.     Right Adnexa: not tender and no mass present.    Left Adnexa: not tender and no mass present.    No cervical motion tenderness, friability or polyp.     IUD strings visualized.     Uterus is not enlarged or tender.  Breasts:    Right: No mass, nipple discharge, skin change or tenderness.     Left: No mass, nipple discharge, skin change or tenderness.  Neck:     Thyroid: No thyromegaly.  Cardiovascular:     Rate and Rhythm: Normal rate and regular rhythm.     Heart sounds: Normal heart sounds. No murmur heard. Pulmonary:     Effort: Pulmonary effort is normal.     Breath sounds: Normal breath sounds.  Abdominal:     Palpations: Abdomen is soft.     Tenderness: There is no abdominal tenderness. There is no guarding or rebound.  Musculoskeletal:        General: Normal range of motion.     Cervical back: Normal range of motion.  Lymphadenopathy:     Cervical: No cervical adenopathy.  Neurological:     General: No focal deficit present.     Mental Status: She is alert and oriented to person, place, and time.     Cranial Nerves: No cranial nerve deficit.  Skin:    General: Skin is warm and dry.  Psychiatric:        Mood and Affect: Mood normal.        Behavior: Behavior normal.        Thought Content:  Thought content normal.        Judgment: Judgment normal.  Vitals reviewed.      Assessment/Plan:  Encounter for annual routine gynecological examination  Encounter for routine checking of intrauterine contraceptive device (IUD); IUD strings in cx os. Has 8 yr indication  Encounter for screening mammogram for malignant neoplasm of breast; pt has appt  Screening for colon cancer - Plan: Ambulatory referral to Gastroenterology; refer to  GI      GYN counsel mammography screening, adequate intake of calcium and vitamin D, diet and exercise     F/U  Return in about 1 year (around 10/16/2023).  Jadan Hinojos B. Sarath Privott, PA-C 10/16/2022 8:34 AM

## 2022-10-16 ENCOUNTER — Encounter: Payer: Self-pay | Admitting: Obstetrics and Gynecology

## 2022-10-16 ENCOUNTER — Ambulatory Visit (INDEPENDENT_AMBULATORY_CARE_PROVIDER_SITE_OTHER): Payer: BC Managed Care – PPO | Admitting: Obstetrics and Gynecology

## 2022-10-16 VITALS — BP 100/70 | Ht 71.0 in | Wt 172.0 lb

## 2022-10-16 DIAGNOSIS — Z30431 Encounter for routine checking of intrauterine contraceptive device: Secondary | ICD-10-CM

## 2022-10-16 DIAGNOSIS — Z01419 Encounter for gynecological examination (general) (routine) without abnormal findings: Secondary | ICD-10-CM | POA: Diagnosis not present

## 2022-10-16 DIAGNOSIS — Z1211 Encounter for screening for malignant neoplasm of colon: Secondary | ICD-10-CM

## 2022-10-16 DIAGNOSIS — Z1231 Encounter for screening mammogram for malignant neoplasm of breast: Secondary | ICD-10-CM

## 2022-10-16 NOTE — Patient Instructions (Signed)
I value your feedback and you entrusting us with your care. If you get a [facility] patient survey, I would appreciate you taking the time to let us know about your experience today. Thank you! ? ? ?

## 2022-10-26 ENCOUNTER — Encounter: Payer: Self-pay | Admitting: *Deleted

## 2022-10-27 ENCOUNTER — Other Ambulatory Visit: Payer: Self-pay | Admitting: Neurology

## 2022-11-02 ENCOUNTER — Telehealth: Payer: Self-pay

## 2022-11-02 ENCOUNTER — Other Ambulatory Visit: Payer: Self-pay

## 2022-11-02 DIAGNOSIS — Z1211 Encounter for screening for malignant neoplasm of colon: Secondary | ICD-10-CM

## 2022-11-02 MED ORDER — NA SULFATE-K SULFATE-MG SULF 17.5-3.13-1.6 GM/177ML PO SOLN: 1.0000 | Freq: Once | ORAL | 0 refills | Status: AC

## 2022-11-02 NOTE — Telephone Encounter (Signed)
Pt left vmm to schedule colonoscopy please return call 

## 2022-11-02 NOTE — Telephone Encounter (Signed)
Gastroenterology Pre-Procedure Review  Request Date: 12/21/22 Requesting Physician: Dr. Servando Snare  PATIENT REVIEW QUESTIONS: The patient responded to the following health history questions as indicated:    1. Are you having any GI issues? no 2. Do you have a personal history of Polyps? no 3. Do you have a family history of Colon Cancer or Polyps? no 4. Diabetes Mellitus? no 5. Joint replacements in the past 12 months?no 6. Major health problems in the past 3 months?no 7. Any artificial heart valves, MVP, or defibrillator?no    MEDICATIONS & ALLERGIES:    Patient reports the following regarding taking any anticoagulation/antiplatelet therapy:   Plavix, Coumadin, Eliquis, Xarelto, Lovenox, Pradaxa, Brilinta, or Effient? no Aspirin? no  Patient confirms/reports the following medications:  Current Outpatient Medications  Medication Sig Dispense Refill   carbamazepine (TEGRETOL XR) 400 MG 12 hr tablet Take 1 tablet (400 mg total) by mouth 2 (two) times daily. 180 tablet 4   levonorgestrel (MIRENA) 20 MCG/24HR IUD 1 Intra Uterine Device (1 each total) by Intrauterine route once for 1 dose. 1 each 0   Multiple Vitamin (MULTIVITAMIN ADULT PO) Take by mouth.     TEGRETOL-XR 100 MG 12 hr tablet TAKE 1 TABLET TWICE A DAY 180 tablet 3   No current facility-administered medications for this visit.    Patient confirms/reports the following allergies:  No Known Allergies  No orders of the defined types were placed in this encounter.   AUTHORIZATION INFORMATION Primary Insurance: 1D#: Group #:  Secondary Insurance: 1D#: Group #:  SCHEDULE INFORMATION: Date: 12/21/22 Time: Location: [facility]

## 2022-11-03 ENCOUNTER — Ambulatory Visit
Admission: RE | Admit: 2022-11-03 | Discharge: 2022-11-03 | Disposition: A | Discharge status: Home / Self Care | Payer: BC Managed Care – PPO | Source: Ambulatory Visit | Attending: Obstetrics and Gynecology | Admitting: Obstetrics and Gynecology

## 2022-11-03 DIAGNOSIS — Z1231 Encounter for screening mammogram for malignant neoplasm of breast: Secondary | ICD-10-CM

## 2022-12-09 ENCOUNTER — Encounter: Payer: Self-pay | Admitting: Gastroenterology

## 2022-12-21 ENCOUNTER — Encounter
Admission: RE | Disposition: A | Discharge status: Home / Self Care | Payer: Self-pay | Source: Home / Self Care | Attending: Gastroenterology

## 2022-12-21 ENCOUNTER — Ambulatory Visit: Payer: BC Managed Care – PPO | Admitting: Anesthesiology

## 2022-12-21 ENCOUNTER — Ambulatory Visit
Admission: RE | Admit: 2022-12-21 | Discharge: 2022-12-21 | Disposition: A | Discharge status: Home / Self Care | Payer: BC Managed Care – PPO | Attending: Gastroenterology | Admitting: Gastroenterology

## 2022-12-21 ENCOUNTER — Other Ambulatory Visit: Payer: Self-pay

## 2022-12-21 ENCOUNTER — Encounter: Payer: Self-pay | Admitting: Gastroenterology

## 2022-12-21 DIAGNOSIS — G40909 Epilepsy, unspecified, not intractable, without status epilepticus: Secondary | ICD-10-CM | POA: Insufficient documentation

## 2022-12-21 DIAGNOSIS — K640 First degree hemorrhoids: Secondary | ICD-10-CM | POA: Insufficient documentation

## 2022-12-21 DIAGNOSIS — Z1211 Encounter for screening for malignant neoplasm of colon: Secondary | ICD-10-CM | POA: Diagnosis present

## 2022-12-21 HISTORY — PX: COLONOSCOPY WITH PROPOFOL: SHX5780

## 2022-12-21 HISTORY — DX: Presence of spectacles and contact lenses: Z97.3

## 2022-12-21 LAB — POCT PREGNANCY, URINE: Preg Test, Ur: NEGATIVE

## 2022-12-21 SURGERY — COLONOSCOPY WITH PROPOFOL
Anesthesia: General

## 2022-12-21 MED ORDER — STERILE WATER FOR IRRIGATION IR SOLN
Status: DC | PRN
  Administered 2022-12-21: 1

## 2022-12-21 MED ORDER — PROPOFOL 10 MG/ML IV BOLUS
INTRAVENOUS | Status: DC | PRN
  Administered 2022-12-21: 30 mg via INTRAVENOUS
  Administered 2022-12-21: 100 mg via INTRAVENOUS
  Administered 2022-12-21: 50 mg via INTRAVENOUS

## 2022-12-21 MED ORDER — LACTATED RINGERS IV SOLN: INTRAVENOUS | Status: DC

## 2022-12-21 MED ORDER — SODIUM CHLORIDE 0.9 % IV SOLN: INTRAVENOUS | Status: DC

## 2022-12-21 MED ORDER — LIDOCAINE HCL (CARDIAC) PF 100 MG/5ML IV SOSY
PREFILLED_SYRINGE | INTRAVENOUS | Status: DC | PRN
  Administered 2022-12-21: 50 mg via INTRAVENOUS

## 2022-12-21 SURGICAL SUPPLY — 6 items
GOWN CVR UNV OPN BCK APRN NK (MISCELLANEOUS) ×2 IMPLANT
GOWN ISOL THUMB LOOP REG UNIV (MISCELLANEOUS) ×2
KIT PRC NS LF DISP ENDO (KITS) ×1 IMPLANT
KIT PROCEDURE OLYMPUS (KITS) ×1
MANIFOLD NEPTUNE II (INSTRUMENTS) ×1 IMPLANT
WATER STERILE IRR 250ML POUR (IV SOLUTION) ×1 IMPLANT

## 2022-12-21 NOTE — Transfer of Care (Signed)
Immediate Anesthesia Transfer of Care Note  Patient: Connie Fuentes  Procedure(s) Performed: COLONOSCOPY WITH PROPOFOL  Patient Location: PACU  Anesthesia Type: General  Level of Consciousness: awake, alert  and patient cooperative  Airway and Oxygen Therapy: Patient Spontanous Breathing and Patient connected to supplemental oxygen  Post-op Assessment: Post-op Vital signs reviewed, Patient's Cardiovascular Status Stable, Respiratory Function Stable, Patent Airway and No signs of Nausea or vomiting  Post-op Vital Signs: Reviewed and stable  Complications: No notable events documented.

## 2022-12-21 NOTE — Anesthesia Preprocedure Evaluation (Signed)
Anesthesia Evaluation  Patient identified by MRN, date of birth, ID band Patient awake    Reviewed: Allergy & Precautions, H&P , NPO status , Patient's Chart, lab work & pertinent test results, reviewed documented beta blocker date and time   History of Anesthesia Complications Negative for: history of anesthetic complications  Airway Mallampati: I  TM Distance: >3 FB Neck ROM: full    Dental no notable dental hx. (+) Dental Advidsory Given   Pulmonary neg pulmonary ROS, Continuous Positive Airway Pressure Ventilation    Pulmonary exam normal breath sounds clear to auscultation       Cardiovascular Exercise Tolerance: Good negative cardio ROS Normal cardiovascular exam Rhythm:regular Rate:Normal     Neuro/Psych Seizures -, Well Controlled,   negative psych ROS   GI/Hepatic negative GI ROS, Neg liver ROS,,,  Endo/Other  negative endocrine ROS    Renal/GU negative Renal ROS  negative genitourinary   Musculoskeletal   Abdominal   Peds  Hematology negative hematology ROS (+)   Anesthesia Other Findings Past Medical History: No date: Fibroadenosis of breast     Comment:  06/2010,08/2011;RT. Dr. Lemar Livings No date: Seizure disorder [facility])     Comment:  no seizures since 2021 No date: Seizures (HCC) No date: Wears contact lenses   Reproductive/Obstetrics negative OB ROS                             Anesthesia Physical Anesthesia Plan  ASA: 2  Anesthesia Plan: General   Post-op Pain Management:    Induction: Intravenous  PONV Risk Score and Plan: 3 and Propofol infusion and TIVA  Airway Management Planned: Natural Airway and Nasal Cannula  Additional Equipment:   Intra-op Plan:   Post-operative Plan:   Informed Consent: I have reviewed the patients History and Physical, chart, labs and discussed the procedure including the risks, benefits and alternatives for the proposed anesthesia  with the patient or authorized representative who has indicated his/her understanding and acceptance.     Dental Advisory Given  Plan Discussed with: Anesthesiologist, CRNA and Surgeon  Anesthesia Plan Comments:        Anesthesia Quick Evaluation

## 2022-12-21 NOTE — Anesthesia Postprocedure Evaluation (Signed)
Anesthesia Post Note  Patient: Connie Fuentes  Procedure(s) Performed: COLONOSCOPY WITH PROPOFOL  Patient location during evaluation: PACU Anesthesia Type: General Level of consciousness: awake and alert Pain management: pain level controlled Vital Signs Assessment: post-procedure vital signs reviewed and stable Respiratory status: spontaneous breathing, nonlabored ventilation, respiratory function stable and patient connected to nasal cannula oxygen Cardiovascular status: blood pressure returned to baseline and stable Postop Assessment: no apparent nausea or vomiting Anesthetic complications: no   No notable events documented.   Last Vitals:  Vitals:   12/21/22 1121 12/21/22 1127  BP: 99/73 105/77  Pulse: 68 60  Resp: 16 16  Temp:    SpO2: 100% 100%    Last Pain:  Vitals:   12/21/22 1127  TempSrc:   PainSc: 0-No pain                 Lenard Simmer

## 2022-12-21 NOTE — H&P (Signed)
Connie Minium, MD [facility] [address] [place], [place] [address] [phone] Fax : [phone]  Primary Care Physician:  Jaclyn Shaggy, MD Primary Gastroenterologist:  Dr. Servando Snare  Pre-Procedure History & Physical: HPI:  Connie Fuentes is a 47 y.o. female is here for an colonoscopy.   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 Dr. Lemar Livings   Seizure disorder [facility])    no seizures since 2021   Seizures [facility])    Wears contact lenses     Past Surgical History:  Procedure Laterality Date   BREAST BIOPSY Right    2012?   BREAST SURGERY Right 06/2010   breast biopsy; fibroadenoma   CESAREAN SECTION     INTRAUTERINE DEVICE (IUD) INSERTION  10/19/2012   mirena    Prior to Admission medications   Medication Sig Start Date End Date Taking? Authorizing Provider  carbamazepine (TEGRETOL XR) 400 MG 12 hr tablet Take 1 tablet (400 mg total) by mouth 2 (two) times daily. Patient taking differently: Take 400 mg by mouth 2 (two) times daily. Take with 100 mg for total of 500 mg 09/16/22  Yes Glean Salvo, NP  levonorgestrel (MIRENA) 20 MCG/24HR IUD 1 Intra Uterine Device (1 each total) by Intrauterine route once for 1 dose. 09/27/17 12/09/22 Yes Copland, Ilona Sorrel, PA-C  Multiple Vitamin (MULTIVITAMIN ADULT PO) Take by mouth.   Yes [provider]  OVER THE COUNTER MEDICATION daily. Darrold Junker - Urinary tract health   Yes [provider]  TEGRETOL-XR 100 MG 12 hr tablet TAKE 1 TABLET TWICE A DAY Patient taking differently: Take 100 mg by mouth 2 (two) times daily. Take with 400 mg for total of 500 mg 10/27/22  Yes Glean Salvo, NP    Allergies as of 11/02/2022   (No Known Allergies)    Family History  Problem Relation Age of Onset   Diabetes Mother    Hyperlipidemia Mother    Hypertension Mother    Cancer Maternal Aunt 35       question cx, uterine or ovar; no chemo/rad   Pancreatic cancer Paternal Uncle 16    Breast cancer Neg Hx     Social History   Socioeconomic History   Marital status: Married    Spouse name: [name]   Number of children: 1   Years of education: 16   Highest education level: Not on file  Occupational History    Comment: [employer]  Tobacco Use   Smoking status: Never   Smokeless tobacco: Never   Tobacco comments:    Was "social" smoker until 2009  Vaping Use   Vaping status: Never Used  Substance and Sexual Activity   Alcohol use: Yes    Comment: Occasional   Drug use: No   Sexual activity: Yes    Birth control/protection: I.U.D.    Comment: Mirena   Other Topics Concern   Not on file  Social History Narrative   Patient is married [name]) and lives at home with her husband and child.   Patient is working full-time.   Patient has a Chief Operating Officer' degree   Patient is right-handed.   Patient drinks two cups of coffee daily.   Social Determinants of Health   Financial Resource Strain: Not on file  Food Insecurity: Not on file  Transportation Needs: Not on file  Physical Activity: Not on file  Stress: Not on file  Social Connections: Not on file  Intimate Partner  Violence: Not on file    Review of Systems: See HPI, otherwise negative ROS  Physical Exam: Ht 5\' 11"  (1.803 m)   Wt 78 kg   BMI 23.99 kg/m  General:   Alert,  pleasant and cooperative in NAD Head:  Normocephalic and atraumatic. Neck:  Supple; no masses or thyromegaly. Lungs:  Clear throughout to auscultation.    Heart:  Regular rate and rhythm. Abdomen:  Soft, nontender and nondistended. Normal bowel sounds, without guarding, and without rebound.   Neurologic:  Alert and  oriented x4;  grossly normal neurologically.  Impression/Plan: Ileta Sadosky Holtrop is here for an colonoscopy to be performed for screening  Risks, benefits, limitations, and alternatives regarding  colonoscopy  have been reviewed with the patient.  Questions have been answered.  All parties agreeable.   Connie Minium, MD   12/21/2022, 10:22 AM

## 2022-12-21 NOTE — Op Note (Signed)
[facility] Gastroenterology Patient Name: Connie Fuentes Procedure Date: 12/21/2022 10:48 AM MRN: [id number] Account #: [id number] Date of Birth: [birth year] Admit Type: Outpatient Age: 47 Room: [facility] OR ROOM 01 Gender: Female Note Status: Finalized Instrument Name: [id number] Procedure:             Colonoscopy Indications:           Screening for colorectal malignant neoplasm Providers:             Midge Minium MD, MD Medicines:             Propofol per Anesthesia Complications:         No immediate complications. Procedure:             Pre-Anesthesia Assessment:                        - Prior to the procedure, a History and Physical was                         performed, and patient medications and allergies were                         reviewed. The patient's tolerance of previous                         anesthesia was also reviewed. The risks and benefits                         of the procedure and the sedation options and risks                         were discussed with the patient. All questions were                         answered, and informed consent was obtained. Prior                         Anticoagulants: The patient has taken no anticoagulant                         or antiplatelet agents. ASA Grade Assessment: II - A                         patient with mild systemic disease. After reviewing                         the risks and benefits, the patient was deemed in                         satisfactory condition to undergo the procedure.                        After obtaining informed consent, the colonoscope was                         passed under direct vision. Throughout the procedure,                         the patient's blood pressure,  pulse, and oxygen                         saturations were monitored continuously. The                         Colonoscope was introduced through the anus and                         advanced to the the cecum,  identified by appendiceal                         orifice and ileocecal valve. The colonoscopy was                         performed without difficulty. The patient tolerated                         the procedure well. The quality of the bowel                         preparation was excellent. Findings:      The perianal and digital rectal examinations were normal.      Non-bleeding internal hemorrhoids were found during retroflexion. The       hemorrhoids were Grade I (internal hemorrhoids that do not prolapse). Impression:            - Non-bleeding internal hemorrhoids.                        - No specimens collected. Recommendation:        - Discharge patient to home.                        - Resume previous diet.                        - Continue present medications.                        - Repeat colonoscopy in 10 years for screening                         purposes. Procedure Code(s):     --- Professional ---                        [phone], Colonoscopy, flexible; diagnostic, including                         collection of specimen(s) by brushing or washing, when                         performed (separate procedure) Diagnosis Code(s):     --- Professional ---                        Z12.11, Encounter for screening for malignant neoplasm                         of colon CPT copyright 2022 American Medical Association. All rights reserved. The codes documented in this  report are preliminary and upon coder review may  be revised to meet current compliance requirements. Midge Minium MD, MD 12/21/2022 11:13:49 AM This report has been signed electronically. Number of Addenda: 0 Note Initiated On: 12/21/2022 10:48 AM Scope Withdrawal Time: 0 hours 6 minutes 50 seconds  Total Procedure Duration: 0 hours 9 minutes 55 seconds  Estimated Blood Loss:  Estimated blood loss: none.      [facility]

## 2022-12-23 ENCOUNTER — Encounter: Payer: Self-pay | Admitting: Gastroenterology

## 2023-04-09 ENCOUNTER — Ambulatory Visit
Admission: EM | Admit: 2023-04-09 | Discharge: 2023-04-09 | Disposition: A | Discharge status: Home / Self Care | Payer: BC Managed Care – PPO | Attending: Emergency Medicine | Admitting: Emergency Medicine

## 2023-04-09 ENCOUNTER — Telehealth: Payer: Self-pay

## 2023-04-09 DIAGNOSIS — R030 Elevated blood-pressure reading, without diagnosis of hypertension: Secondary | ICD-10-CM | POA: Diagnosis present

## 2023-04-09 DIAGNOSIS — R300 Dysuria: Secondary | ICD-10-CM | POA: Insufficient documentation

## 2023-04-09 LAB — POCT URINALYSIS DIP (MANUAL ENTRY)
Bilirubin, UA: NEGATIVE
Glucose, UA: NEGATIVE mg/dL
Ketones, POC UA: NEGATIVE mg/dL
Nitrite, UA: NEGATIVE
Protein Ur, POC: NEGATIVE mg/dL
Spec Grav, UA: 1.015 (ref 1.010–1.025)
Urobilinogen, UA: 0.2 U/dL
pH, UA: 6 (ref 5.0–8.0)

## 2023-04-09 LAB — POCT URINE PREGNANCY: Preg Test, Ur: NEGATIVE

## 2023-04-09 MED ORDER — CEPHALEXIN 500 MG PO CAPS: 500.0000 mg | ORAL_CAPSULE | Freq: Two times a day (BID) | ORAL | 0 refills | Status: AC

## 2023-04-09 NOTE — Discharge Instructions (Addendum)
Take the antibiotic as directed.  The urine culture is pending.  We will call you if it shows the need to change or discontinue your antibiotic.    Your blood pressure is elevated today at 146/96; repeat 150/97.  Please have this rechecked by your primary care provider in 2-4 weeks.

## 2023-04-09 NOTE — ED Provider Notes (Signed)
Renaldo Fiddler    CSN: [id number] Arrival date & time: 04/09/23  1340      History   Chief Complaint Chief Complaint  Patient presents with   Urinary Frequency    HPI Connie Fuentes is a 47 y.o. female.  Patient presents with dysuria, urinary frequency, bladder pressure since this morning.  She states this is similar to previous episodes of recurrent UTI; last occurred approximately 6 months ago.  She took Azo just prior to arrival.  No fever, vomiting, diarrhea, constipation, vaginal discharge, flank pain, pelvic pain.  The history is provided by the patient and medical records.    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 Dr. 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   BREAST SURGERY Right 06/2010   breast biopsy; fibroadenoma   CESAREAN SECTION     COLONOSCOPY WITH PROPOFOL N/A 12/21/2022   Procedure: COLONOSCOPY WITH PROPOFOL;  Surgeon: Midge Minium, MD;  Location: [facility];  Service: Endoscopy;  Laterality: N/A;   INTRAUTERINE DEVICE (IUD) INSERTION  10/19/2012   mirena    OB History     Gravida  1   Para  1   Term  1   Preterm      AB      Living  1      SAB      IAB      Ectopic      Multiple      Live Births  1            Home Medications    Prior to Admission medications   Medication Sig Start Date End Date Taking? Authorizing Provider  carbamazepine (TEGRETOL XR) 400 MG 12 hr tablet Take 1 tablet (400 mg total) by mouth 2 (two) times daily. Patient taking differently: Take  400 mg by mouth 2 (two) times daily. Take with 100 mg for total of 500 mg 09/16/22  Yes Glean Salvo, NP  cephALEXin (KEFLEX) 500 MG capsule Take 1 capsule (500 mg total) by mouth 2 (two) times daily for 5 days. 04/09/23 04/14/23 Yes Mickie Bail, NP  OVER THE COUNTER MEDICATION daily. Darrold Junker - Urinary tract health   Yes [provider]  levonorgestrel (MIRENA) 20 MCG/24HR IUD 1 Intra Uterine Device (1 each total) by Intrauterine route once for 1 dose. 09/27/17 12/09/22  Copland, Ilona Sorrel, PA-C  Multiple Vitamin (MULTIVITAMIN ADULT PO) Take by mouth.    [provider]  TEGRETOL-XR 100 MG 12 hr tablet TAKE 1 TABLET TWICE A DAY Patient taking differently: Take 100 mg by mouth 2 (two) times daily. Take with 400 mg for total of 500 mg 10/27/22   Glean Salvo, NP    Family History Family History  Problem Relation Age of Onset   Diabetes Mother    Hyperlipidemia Mother    Hypertension Mother    Cancer Maternal Aunt 35       question cx, uterine or ovar; no chemo/rad   Pancreatic cancer  Paternal Uncle 47   Breast cancer Neg Hx     Social History Social History   Tobacco Use   Smoking status: Never   Smokeless tobacco: Never   Tobacco comments:    Was "social" smoker until 2009  Vaping Use   Vaping status: Never Used  Substance Use Topics   Alcohol use: Yes    Comment: Occasional   Drug use: No     Allergies   Patient has no known allergies.   Review of Systems Review of Systems  Gastrointestinal:  Positive for abdominal pain. Negative for constipation, diarrhea, nausea and vomiting.  Genitourinary:  Positive for dysuria and frequency. Negative for flank pain, hematuria, pelvic pain and vaginal discharge.     Physical Exam Triage Vital Signs ED Triage Vitals  Encounter Vitals Group     BP 04/09/23 1550 (!) 146/96     Systolic BP Percentile --      Diastolic BP Percentile --      Pulse Rate 04/09/23 1546 60     Resp 04/09/23 1546 14     Temp 04/09/23  1546 98.7 F (37.1 C)     Temp Source 04/09/23 1546 Oral     SpO2 04/09/23 1546 98 %     Weight --      Height --      Head Circumference --      Peak Flow --      Pain Score 04/09/23 1546 2     Pain Loc --      Pain Education --      Exclude from Growth Chart --    No data found.  Updated Vital Signs BP (!) 150/97   Pulse 60   Temp 98.7 F (37.1 C) (Oral)   Resp 14   LMP  (LMP Unknown)   SpO2 98%   Visual Acuity Right Eye Distance:   Left Eye Distance:   Bilateral Distance:    Right Eye Near:   Left Eye Near:    Bilateral Near:     Physical Exam Constitutional:      General: She is not in acute distress. HENT:     Mouth/Throat:     Mouth: Mucous membranes are moist.  Cardiovascular:     Rate and Rhythm: Normal rate and regular rhythm.     Heart sounds: Normal heart sounds.  Pulmonary:     Effort: Pulmonary effort is normal. No respiratory distress.     Breath sounds: Normal breath sounds.  Abdominal:     General: Bowel sounds are normal.     Palpations: Abdomen is soft.     Tenderness: There is no abdominal tenderness. There is no right CVA tenderness, left CVA tenderness, guarding or rebound.  Skin:    General: Skin is warm and dry.  Neurological:     Mental Status: She is alert.      UC Treatments / Results  Labs (all labs ordered are listed, but only abnormal results are displayed) Labs Reviewed  POCT URINALYSIS DIP (MANUAL ENTRY) - Abnormal; Notable for the following components:      Result Value   Clarity, UA turbid (*)    Blood, UA trace-intact (*)    Leukocytes, UA Small (1+) (*)    All other components within normal limits  URINE CULTURE  POCT URINE PREGNANCY    EKG   Radiology No results found.  Procedures Procedures (including critical care time)  Medications Ordered in UC Medications - No data to display  Initial Impression / Assessment and Plan / UC Course  I have reviewed the triage vital signs and the nursing  notes.  Pertinent labs & imaging results that were available during my care of the patient were reviewed by me and considered in my medical decision making (see chart for details).    Dysuria, Elevated blood pressure.  Treating with Keflex. Urine culture pending. Discussed with patient that we will call her if the urine culture shows the need to change or discontinue the antibiotic.  Also discussed with patient that her blood pressure is elevated today and needs to be rechecked by PCP in 2 to 4 weeks.  Education provided on preventing hypertension.  Patient agrees to plan of care.     Final Clinical Impressions(s) / UC Diagnoses   Final diagnoses:  Dysuria  Elevated blood pressure reading     Discharge Instructions      Take the antibiotic as directed.  The urine culture is pending.  We will call you if it shows the need to change or discontinue your antibiotic.    Your blood pressure is elevated today at 146/96; repeat 150/97.  Please have this rechecked by your primary care provider in 2-4 weeks.          ED Prescriptions     Medication Sig Dispense Auth. Provider   cephALEXin (KEFLEX) 500 MG capsule Take 1 capsule (500 mg total) by mouth 2 (two) times daily for 5 days. 10 capsule Mickie Bail, NP      PDMP not reviewed this encounter.   Mickie Bail, NP 04/09/23 [phone]

## 2023-04-09 NOTE — ED Triage Notes (Signed)
Urinary frequency, burning with urination, lower abdominal pain that started today. Taking azo just took it before she arrived here.

## 2023-04-09 NOTE — Telephone Encounter (Signed)
Pt calling with uti sxs; can something be called in or does she need to be seen; adv to be seen; pt aware per TN that we have no nurse visit openings until the middle of next week; adv pt her best bet would be to go to UC.

## 2023-04-12 LAB — URINE CULTURE: Culture: 20000 — AB

## 2023-05-17 NOTE — Progress Notes (Signed)
 Connie Fuentes, Connie Fuentes   Chief Complaint  Patient presents with   Urinary Tract Infection    Frequency and pain urinating, pelvic and back pain x 2 days    HPI:      Connie Fuentes is a 48 y.o. G1P1001 whose LMP was No LMP recorded. (Menstrual status: IUD)., presents today for UTI sx of frequency, urgency, dysuria, mild LBP, pelvic discomfort since yesterday AM. No hematuria, fevers, vag sx. Taking AZO with some relief. Had E.Coli on C&S 12/24, treated with keflex  with sx relief. Sx started after sex a couple days ago, which seems to be a trigger. Voids immediately after sex, drinks lots of water . Previous UTI about 4-6 months prior to 12/24.    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   BREAST SURGERY Right 06/2010   breast biopsy; fibroadenoma   CESAREAN SECTION     COLONOSCOPY WITH PROPOFOL  N/A 12/21/2022   Procedure: COLONOSCOPY WITH PROPOFOL ;  Surgeon: Jinny Carmine, Connie Fuentes;  Location: [facility];  Service: Endoscopy;  Laterality: N/A;   INTRAUTERINE DEVICE (IUD) INSERTION  10/19/2012   mirena     Family History  Problem Relation Age of Onset   Diabetes Mother    Hyperlipidemia Mother    Hypertension Mother    Cancer Maternal Aunt 56       question cx, uterine or ovar; no chemo/rad   Pancreatic cancer Paternal Uncle 9   Breast cancer Neg Hx     Social History   Socioeconomic History   Marital status: Married    Spouse name: Lamar   Number of children: 1   Years of education: 16   Highest education level: Not on file  Occupational History    Comment: [employer]  Tobacco Use   Smoking status: Never   Smokeless tobacco: Never   Tobacco comments:    Was social smoker  until 2009  Vaping Use   Vaping status: Never Used  Substance and Sexual Activity   Alcohol use: Yes    Comment: Occasional   Drug use: No   Sexual activity: Yes    Birth control/protection: I.U.D.    Comment: Mirena    Other Topics Concern   Not on file  Social History Narrative   Patient is married [name] and lives at home with her husband and child.   Patient is working full-time.   Patient has a Chief Operating Officer' degree   Patient is right-handed.   Patient drinks two cups of coffee daily.   Social Drivers of Corporate Investment Banker Strain: Not on file  Food Insecurity: Not on file  Transportation Needs: Not on file  Physical Activity: Not on file  Stress: Not on file  Social Connections: Not on file  Intimate Partner Violence: Not on file    Outpatient Medications Prior to Visit  Medication Sig Dispense Refill   carbamazepine  (TEGRETOL  XR) 400 MG 12 hr tablet Take 1 tablet (400 mg total) by mouth 2 (two) times daily. (Patient taking differently: Take 400 mg by mouth 2 (two) times daily. Take with 100 mg for total of 500 mg) 180 tablet 4   Multiple Vitamin (MULTIVITAMIN ADULT PO) Take by mouth.  OVER THE COUNTER MEDICATION daily. [facility] - Urinary tract health     TEGRETOL -XR 100 MG 12 hr tablet TAKE 1 TABLET TWICE A DAY (Patient taking differently: Take 100 mg by mouth 2 (two) times daily. Take with 400 mg for total of 500 mg) 180 tablet 3   levonorgestrel  (MIRENA ) 20 MCG/24HR IUD 1 Intra Uterine Device (1 each total) by Intrauterine route once for 1 dose. 1 each 0   No facility-administered medications prior to visit.      ROS:  Review of Systems  Constitutional:  Negative for fever.  Gastrointestinal:  Negative for blood in stool, constipation, diarrhea, nausea and vomiting.  Genitourinary:  Positive for dysuria, frequency and urgency. Negative for dyspareunia, flank pain, hematuria, vaginal bleeding, vaginal discharge and vaginal pain.  Musculoskeletal:   Negative for back pain.  Skin:  Negative for rash.   BREAST: No symptoms   OBJECTIVE:   Vitals:  BP 120/78   Pulse 62   Ht 5' 11 (1.803 m)   Wt 174 lb (78.9 kg)   BMI 24.27 kg/m   Physical Exam Vitals reviewed.  Constitutional:      Appearance: She is well-developed.  Pulmonary:     Effort: Pulmonary effort is normal.  Musculoskeletal:        General: Normal range of motion.     Cervical back: Normal range of motion.  Skin:    General: Skin is warm and dry.  Neurological:     General: No focal deficit present.     Mental Status: She is alert and oriented to person, place, and time.     Cranial Nerves: No cranial nerve deficit.  Psychiatric:        Mood and Affect: Mood normal.        Behavior: Behavior normal.        Thought Content: Thought content normal.        Judgment: Judgment normal.     Results: Results for orders placed or performed in visit on 05/18/23 (from the past 24 hours)  POCT Urinalysis Dipstick     Status: Abnormal   Collection Time: 05/18/23  9:54 AM  Result Value Ref Range   Color, UA orange    Clarity, UA clear    Glucose, UA     Bilirubin, UA     Ketones, UA     Spec Grav, UA 1.025 1.010 - 1.025   Blood, UA small    pH, UA     Protein, UA     Urobilinogen, UA     Nitrite, UA     Leukocytes, UA Moderate (2+) (A) Negative   Appearance     Odor     PT TAKING AZO  Assessment/Plan: Acute cystitis with hematuria - Plan: POCT Urinalysis Dipstick, Urine Culture, nitrofurantoin , macrocrystal-monohydrate, (MACROBID ) 100 MG capsule; pos sx and UA. Rx macrobid , check C&S. If bacteria is susc, will then Rx macrobid  for postcoital prevention. F/u prn.    Meds ordered this encounter  Medications   nitrofurantoin , macrocrystal-monohydrate, (MACROBID ) 100 MG capsule    Sig: Take 1 capsule (100 mg total) by mouth 2 (two) times daily for 7 days.    Dispense:  14 capsule    Refill:  0    Supervising Provider:   CHERRY, ANIKA [[id number]]       Return if symptoms worsen or fail to improve.  Coutney Wildermuth B. Nahuel Wilbert, PA-C 05/18/2023 9:56 AM

## 2023-05-18 ENCOUNTER — Ambulatory Visit: Payer: Self-pay | Admitting: Obstetrics and Gynecology

## 2023-05-18 ENCOUNTER — Encounter: Payer: Self-pay | Admitting: Obstetrics and Gynecology

## 2023-05-18 VITALS — BP 120/78 | HR 62 | Ht 71.0 in | Wt 174.0 lb

## 2023-05-18 DIAGNOSIS — N3001 Acute cystitis with hematuria: Secondary | ICD-10-CM | POA: Diagnosis not present

## 2023-05-18 LAB — POCT URINALYSIS DIPSTICK: Spec Grav, UA: 1.025 (ref 1.010–1.025)

## 2023-05-18 MED ORDER — NITROFURANTOIN MONOHYD MACRO 100 MG PO CAPS
100.0000 mg | ORAL_CAPSULE | Freq: Two times a day (BID) | ORAL | 0 refills | Status: DC
Start: 2023-05-18 — End: 2023-05-25

## 2023-05-18 NOTE — Patient Instructions (Signed)
 I value your feedback and you entrusting [place] with your care. If you get a  patient survey, I would appreciate you taking the time to let us know about your experience today. Thank you! ? ? ?

## 2023-05-20 ENCOUNTER — Encounter: Payer: Self-pay | Admitting: Obstetrics and Gynecology

## 2023-05-20 LAB — URINE CULTURE

## 2023-05-20 MED ORDER — NITROFURANTOIN MONOHYD MACRO 100 MG PO CAPS
ORAL_CAPSULE | ORAL | 0 refills | Status: DC
Start: 2023-05-20 — End: 2023-11-18

## 2023-05-20 NOTE — Addendum Note (Signed)
 Addended by: Alyn Judge B on: 05/20/2023 08:08 AM   Modules accepted: Orders

## 2023-09-22 ENCOUNTER — Encounter: Payer: Self-pay | Admitting: Neurology

## 2023-09-22 ENCOUNTER — Telehealth: Payer: BC Managed Care – PPO | Admitting: Neurology

## 2023-09-22 DIAGNOSIS — R569 Unspecified convulsions: Secondary | ICD-10-CM

## 2023-09-22 DIAGNOSIS — G40209 Localization-related (focal) (partial) symptomatic epilepsy and epileptic syndromes with complex partial seizures, not intractable, without status epilepticus: Secondary | ICD-10-CM

## 2023-09-22 MED ORDER — CARBAMAZEPINE ER 100 MG PO TB12: 100.0000 mg | ORAL_TABLET | Freq: Two times a day (BID) | ORAL | 4 refills | Status: DC

## 2023-09-22 MED ORDER — CARBAMAZEPINE ER 400 MG PO TB12: 400.0000 mg | ORAL_TABLET | Freq: Two times a day (BID) | ORAL | 4 refills | Status: DC

## 2023-09-22 MED ORDER — TEGRETOL-XR 100 MG PO TB12: 100.0000 mg | ORAL_TABLET | Freq: Two times a day (BID) | ORAL | 4 refills | Status: DC

## 2023-09-22 NOTE — Progress Notes (Signed)
   Virtual Visit via Video Note  I connected with Connie Fuentes on 09/22/23 at  2:30 PM EDT by a video enabled telemedicine application and verified that I am speaking with the correct person using two identifiers.  Location: Patient: at her home  Provider: in the office    I discussed the limitations of evaluation and management by telemedicine and the availability of in person appointments. The patient expressed understanding and agreed to proceed.  History of Present Illness: Update 09/22/23 SS: Via VV. No health issues.  No seizures.  Remains on carbamazepine . Takes 400+100=500 mg BID.  Did not have labs last year.  No side effects.  Update 09/16/22 SS: Here today via virtual visit, continues to do well.  No recent seizures.  Remains on carbamazepine  12 hr tablet 500 mg twice daily.  Tolerates well.  No new health issues.  Labs in May 2023 showed carbamazepine  level 10.8, CBC and CMP were unremarkable.  No new issues or concerns.  09/01/21 SS: Dois Freeze Fuentes is here today for follow-up.  Remains on Tegretol .  Last seizure was in 2019.  Doing overall well.  She is going through menopause, having hot flashes,felt related to syncope last year with dehydration, none since. Sometimes headaches, not significant, relieved with OTC medications. Works full-time.  Has a 47 year old daughter.    Observations/Objective: Via video visit is alert and oriented, speech is clear and concise, facial symmetry noted, moves about freely  Assessment and Plan: 1.  Seizures -Doing very well, last seizure in 2019 -Continue generic carbamazepine  12-hour tablet 100+400 mg = 500 mg twice daily -Will send orders to [facility] for routine labs, let me know day before she goes, I can also fax   Orders Placed This Encounter  Procedures   CBC with Differential/Platelet   CMP   Carbamazepine  level, total   Vitamin D, 25-hydroxy   Follow Up Instructions: 1 year, call for any seizures   I discussed the  assessment and treatment plan with the patient. The patient was provided an opportunity to ask questions and all were answered. The patient agreed with the plan and demonstrated an understanding of the instructions.   The patient was advised to call back or seek an in-person evaluation if the symptoms worsen or if the condition fails to improve as anticipated.  Cortland Ding, DNP  [facility] [address] [place], [place] [address] [phone]

## 2023-09-22 NOTE — Patient Instructions (Signed)
 We will continue current medications.  Please call procedures.  Please go to [facility] to have blood work drawn.  Call for any seizures.  Follow-up in 1 year.  Thanks!!

## 2023-09-27 ENCOUNTER — Telehealth: Payer: Self-pay | Admitting: Neurology

## 2023-09-27 MED ORDER — CARBAMAZEPINE ER 400 MG PO TB12: 400.0000 mg | ORAL_TABLET | Freq: Two times a day (BID) | ORAL | 4 refills | Status: DC

## 2023-09-27 MED ORDER — CARBAMAZEPINE ER 100 MG PO TB12: 100.0000 mg | ORAL_TABLET | Freq: Two times a day (BID) | ORAL | 4 refills | Status: DC

## 2023-09-27 NOTE — Telephone Encounter (Signed)
  [facility] [place] Code) Have refill for carbamazepine  (TEGRETOL -XR) 100 MG 12 hr tablet ;can we fill with generic brand? Would save the patient $244.13  Reference number: [id number]

## 2023-09-27 NOTE — Telephone Encounter (Signed)
 Marni from [facility] called back to get verification for pt medication carbamazepine  (TEGRETOL -XR) 100 MG 12 hr tablet   call BACK NUMBER IS  [phone]   Reference #[id number]

## 2023-09-27 NOTE — Telephone Encounter (Signed)
 Meds ordered this encounter  Medications   carbamazepine  (TEGRETOL -XR) 100 MG 12 hr tablet    Sig: Take 1 tablet (100 mg total) by mouth 2 (two) times daily.    Dispense:  180 tablet    Refill:  4   carbamazepine  (TEGRETOL  XR) 400 MG 12 hr tablet    Sig: Take 1 tablet (400 mg total) by mouth 2 (two) times daily.    Dispense:  180 tablet    Refill:  4

## 2023-09-27 NOTE — Telephone Encounter (Signed)
 Called and spoke to pt and clarified that she is okay with her being on generic carbamazepine  400 mg dose and 100 mg dose.   Sarah please prescribe Thanks,  [employer]

## 2023-09-29 NOTE — Telephone Encounter (Signed)
 [facility] from [facility] is asking for a call to clarify the Rx for TEGRETOL  XR for pt, her call back #[phone] option 2 ref#[id number]

## 2023-09-29 NOTE — Telephone Encounter (Signed)
 Call to pharmacy and spoke with Larinda Plover. Clarified dosing with and medication pharmacy

## 2023-11-13 ENCOUNTER — Other Ambulatory Visit: Payer: Self-pay | Admitting: Neurology

## 2023-11-17 ENCOUNTER — Other Ambulatory Visit: Payer: Self-pay | Admitting: Neurology

## 2023-11-17 ENCOUNTER — Other Ambulatory Visit: Payer: Self-pay | Admitting: Obstetrics and Gynecology

## 2023-11-17 DIAGNOSIS — N3001 Acute cystitis with hematuria: Secondary | ICD-10-CM

## 2023-11-17 NOTE — Telephone Encounter (Signed)
 Darice from [facility] called to get clarification on which medication to fill for Pt    TEGRETOL -XR 100 MG 12 hr tablet  carbamazepine  (TEGRETOL  XR) 400 MG 12 hr tablet   [facility] [place], [place] - [address] [facility] AT Portal to [facility]istered [facility] Sites (Ph: [phone])

## 2023-12-23 ENCOUNTER — Other Ambulatory Visit: Payer: Self-pay | Admitting: Obstetrics and Gynecology

## 2023-12-23 DIAGNOSIS — Z1231 Encounter for screening mammogram for malignant neoplasm of breast: Secondary | ICD-10-CM

## 2023-12-28 ENCOUNTER — Encounter

## 2023-12-30 ENCOUNTER — Ambulatory Visit
Admission: RE | Admit: 2023-12-30 | Discharge: 2023-12-30 | Disposition: A | Discharge status: Home / Self Care | Source: Ambulatory Visit

## 2023-12-30 DIAGNOSIS — Z1231 Encounter for screening mammogram for malignant neoplasm of breast: Secondary | ICD-10-CM

## 2024-01-03 ENCOUNTER — Ambulatory Visit: Payer: Self-pay | Admitting: Obstetrics and Gynecology

## 2024-01-23 ENCOUNTER — Other Ambulatory Visit: Payer: Self-pay | Admitting: Obstetrics and Gynecology

## 2024-01-23 DIAGNOSIS — N3001 Acute cystitis with hematuria: Secondary | ICD-10-CM

## 2024-09-20 ENCOUNTER — Telehealth: Admitting: Neurology
